# Patient Record
Sex: Male | Born: 1990 | Race: White | Hispanic: No | Marital: Single | Smoking: Current every day smoker
Health system: Southern US, Community
[De-identification: ages and names within clinical notes are randomized; demographics above are authoritative.]

## PROBLEM LIST (undated history)

## (undated) HISTORY — PX: TONSILLECTOMY: SUR1361

## (undated) HISTORY — PX: FRACTURE SURGERY: SHX138

---
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
## 2013-01-27 ENCOUNTER — Emergency Department: Payer: Self-pay | Admitting: Emergency Medicine

## 2013-01-27 LAB — CBC WITH DIFFERENTIAL/PLATELET
Basophil %: 0.1 %
Eosinophil #: 0 10*3/uL (ref 0.0–0.7)
HCT: 43.9 % (ref 40.0–52.0)
HGB: 15.1 g/dL (ref 13.0–18.0)
Lymphocyte #: 2.7 10*3/uL (ref 1.0–3.6)
MCV: 89 fL (ref 80–100)
Neutrophil #: 24.1 10*3/uL — ABNORMAL HIGH (ref 1.4–6.5)
Platelet: 154 10*3/uL (ref 150–440)
RBC: 4.92 10*6/uL (ref 4.40–5.90)

## 2013-01-28 ENCOUNTER — Inpatient Hospital Stay: Payer: Self-pay | Admitting: Internal Medicine

## 2013-01-28 LAB — CBC
MCH: 31.2 pg (ref 26.0–34.0)
MCHC: 35.1 g/dL (ref 32.0–36.0)
MCV: 89 fL (ref 80–100)
Platelet: 123 10*3/uL — ABNORMAL LOW (ref 150–440)
RDW: 13.9 % (ref 11.5–14.5)

## 2013-01-28 LAB — COMPREHENSIVE METABOLIC PANEL
Alkaline Phosphatase: 83 U/L (ref 50–136)
Anion Gap: 5 — ABNORMAL LOW (ref 7–16)
Calcium, Total: 9 mg/dL (ref 8.5–10.1)
Chloride: 100 mmol/L (ref 98–107)
Co2: 30 mmol/L (ref 21–32)
Creatinine: 1.1 mg/dL (ref 0.60–1.30)
EGFR (African American): >60
Sodium: 135 mmol/L — ABNORMAL LOW (ref 136–145)
Total Protein: 6.8 g/dL (ref 6.4–8.2)

## 2013-01-29 LAB — CBC WITH DIFFERENTIAL/PLATELET
HCT: 35.8 % — ABNORMAL LOW (ref 40.0–52.0)
Lymphocyte #: 1.7 10*3/uL (ref 1.0–3.6)
Lymphocyte %: 14.4 %
MCH: 31.1 pg (ref 26.0–34.0)
MCHC: 34.3 g/dL (ref 32.0–36.0)
MCV: 91 fL (ref 80–100)
Monocyte #: 1 x10 3/mm (ref 0.2–1.0)
Monocyte %: 8 %
Neutrophil #: 9.1 10*3/uL — ABNORMAL HIGH (ref 1.4–6.5)
Neutrophil %: 76.5 %
Platelet: 98 10*3/uL — ABNORMAL LOW (ref 150–440)
RBC: 3.96 10*6/uL — ABNORMAL LOW (ref 4.40–5.90)
RDW: 13.4 % (ref 11.5–14.5)
WBC: 11.9 10*3/uL — ABNORMAL HIGH (ref 3.8–10.6)

## 2013-01-29 LAB — BASIC METABOLIC PANEL
Anion Gap: 7 (ref 7–16)
BUN: 8 mg/dL (ref 7–18)
Calcium, Total: 7.9 mg/dL — ABNORMAL LOW (ref 8.5–10.1)
Co2: 23 mmol/L (ref 21–32)
Creatinine: 1.04 mg/dL (ref 0.60–1.30)
EGFR (African American): >60
EGFR (Non-African Amer.): >60
Osmolality: 272 (ref 275–301)
Sodium: 137 mmol/L (ref 136–145)

## 2013-01-29 LAB — TSH: Thyroid Stimulating Horm: 0.7 u[IU]/mL

## 2013-01-29 LAB — VANCOMYCIN, TROUGH: Vancomycin, Trough: 8 ug/mL — ABNORMAL LOW (ref 10–20)

## 2013-01-30 LAB — BASIC METABOLIC PANEL
Calcium, Total: 8.8 mg/dL (ref 8.5–10.1)
Co2: 29 mmol/L (ref 21–32)
Creatinine: 0.92 mg/dL (ref 0.60–1.30)
EGFR (Non-African Amer.): >60
Glucose: 92 mg/dL (ref 65–99)

## 2013-01-30 LAB — CBC WITH DIFFERENTIAL/PLATELET
Basophil #: 0 10*3/uL (ref 0.0–0.1)
Basophil %: 0.2 %
HCT: 39.2 % — ABNORMAL LOW (ref 40.0–52.0)
HGB: 13.2 g/dL (ref 13.0–18.0)
MCH: 30.8 pg (ref 26.0–34.0)
MCHC: 33.7 g/dL (ref 32.0–36.0)
Neutrophil #: 5.7 10*3/uL (ref 1.4–6.5)
Platelet: 116 10*3/uL — ABNORMAL LOW (ref 150–440)
RBC: 4.29 10*6/uL — ABNORMAL LOW (ref 4.40–5.90)
RDW: 13.5 % (ref 11.5–14.5)
WBC: 8.4 10*3/uL (ref 3.8–10.6)

## 2013-02-02 LAB — CULTURE, BLOOD (SINGLE)

## 2013-02-21 ENCOUNTER — Emergency Department: Payer: Self-pay | Admitting: Emergency Medicine

## 2013-02-21 LAB — ETHANOL
Ethanol %: <0.003 % (ref 0.000–0.080)
Ethanol: <3 mg/dL

## 2013-02-21 LAB — DRUG SCREEN, URINE
Amphetamines, Ur Screen: NEGATIVE (ref ?–1000)
Barbiturates, Ur Screen: NEGATIVE (ref ?–200)
Benzodiazepine, Ur Scrn: NEGATIVE (ref ?–200)
Cocaine Metabolite,Ur ~~LOC~~: POSITIVE (ref ?–300)
MDMA (Ecstasy)Ur Screen: NEGATIVE (ref ?–500)
Opiate, Ur Screen: POSITIVE (ref ?–300)
Phencyclidine (PCP) Ur S: NEGATIVE (ref ?–25)
Tricyclic, Ur Screen: NEGATIVE (ref ?–1000)

## 2013-02-21 LAB — URINALYSIS, COMPLETE
Glucose,UR: NEGATIVE mg/dL (ref 0–75)
Ketone: NEGATIVE
Nitrite: NEGATIVE
Ph: 7 (ref 4.5–8.0)
Protein: NEGATIVE
Specific Gravity: 1.01 (ref 1.003–1.030)
Squamous Epithelial: NONE SEEN
WBC UR: <1 /HPF (ref 0–5)

## 2013-02-21 LAB — CBC
HGB: 14.9 g/dL (ref 13.0–18.0)
MCHC: 35 g/dL (ref 32.0–36.0)
MCV: 90 fL (ref 80–100)
Platelet: 163 10*3/uL (ref 150–440)
RBC: 4.74 10*6/uL (ref 4.40–5.90)
RDW: 13.6 % (ref 11.5–14.5)

## 2013-02-21 LAB — COMPREHENSIVE METABOLIC PANEL
Alkaline Phosphatase: 69 U/L (ref 50–136)
Anion Gap: 6 — ABNORMAL LOW (ref 7–16)
Bilirubin,Total: 0.4 mg/dL (ref 0.2–1.0)
Calcium, Total: 9.1 mg/dL (ref 8.5–10.1)
Chloride: 102 mmol/L (ref 98–107)
Co2: 28 mmol/L (ref 21–32)
Creatinine: 1.14 mg/dL (ref 0.60–1.30)
EGFR (African American): >60
Glucose: 88 mg/dL (ref 65–99)
SGPT (ALT): 27 U/L (ref 12–78)

## 2013-10-06 ENCOUNTER — Emergency Department: Payer: Self-pay | Admitting: Emergency Medicine

## 2013-10-06 LAB — URINALYSIS, COMPLETE
BACTERIA: NONE SEEN
BILIRUBIN, UR: NEGATIVE
Blood: NEGATIVE
Glucose,UR: NEGATIVE mg/dL (ref 0–75)
Ketone: NEGATIVE
Leukocyte Esterase: NEGATIVE
Nitrite: NEGATIVE
PH: 6 (ref 4.5–8.0)
Protein: NEGATIVE
RBC, UR: NONE SEEN /HPF (ref 0–5)
SPECIFIC GRAVITY: 1.004 (ref 1.003–1.030)
SQUAMOUS EPITHELIAL: NONE SEEN

## 2013-10-06 LAB — CBC
HCT: 46.4 % (ref 40.0–52.0)
HGB: 15.3 g/dL (ref 13.0–18.0)
MCH: 29.7 pg (ref 26.0–34.0)
MCHC: 33 g/dL (ref 32.0–36.0)
MCV: 90 fL (ref 80–100)
PLATELETS: 227 10*3/uL (ref 150–440)
RBC: 5.15 10*6/uL (ref 4.40–5.90)
RDW: 13.1 % (ref 11.5–14.5)
WBC: 10.2 10*3/uL (ref 3.8–10.6)

## 2013-10-06 LAB — SALICYLATE LEVEL

## 2013-10-06 LAB — COMPREHENSIVE METABOLIC PANEL
ALT: 25 U/L (ref 12–78)
Albumin: 4.3 g/dL (ref 3.4–5.0)
Alkaline Phosphatase: 69 U/L
Anion Gap: 6 — ABNORMAL LOW (ref 7–16)
BUN: 8 mg/dL (ref 7–18)
Bilirubin,Total: 0.5 mg/dL (ref 0.2–1.0)
Calcium, Total: 9.3 mg/dL (ref 8.5–10.1)
Chloride: 105 mmol/L (ref 98–107)
Co2: 29 mmol/L (ref 21–32)
Creatinine: 1.42 mg/dL — ABNORMAL HIGH (ref 0.60–1.30)
EGFR (African American): >60
EGFR (Non-African Amer.): >60
Glucose: 84 mg/dL (ref 65–99)
OSMOLALITY: 277 (ref 275–301)
Potassium: 3.7 mmol/L (ref 3.5–5.1)
SGOT(AST): 28 U/L (ref 15–37)
SODIUM: 140 mmol/L (ref 136–145)
Total Protein: 7.8 g/dL (ref 6.4–8.2)

## 2013-10-06 LAB — DRUG SCREEN, URINE
Amphetamines, Ur Screen: NEGATIVE (ref ?–1000)
BARBITURATES, UR SCREEN: NEGATIVE (ref ?–200)
BENZODIAZEPINE, UR SCRN: POSITIVE (ref ?–200)
CANNABINOID 50 NG, UR ~~LOC~~: NEGATIVE (ref ?–50)
Cocaine Metabolite,Ur ~~LOC~~: NEGATIVE (ref ?–300)
MDMA (ECSTASY) UR SCREEN: NEGATIVE (ref ?–500)
METHADONE, UR SCREEN: NEGATIVE (ref ?–300)
OPIATE, UR SCREEN: POSITIVE (ref ?–300)
Phencyclidine (PCP) Ur S: NEGATIVE (ref ?–25)
TRICYCLIC, UR SCREEN: NEGATIVE (ref ?–1000)

## 2013-10-06 LAB — ETHANOL
ETHANOL %: 0.063 % (ref 0.000–0.080)
Ethanol: 63 mg/dL

## 2013-10-06 LAB — ACETAMINOPHEN LEVEL

## 2014-08-21 NOTE — H&P (Signed)
PATIENT NAME:  Clayton Oconnell, Clayton Oconnell MR#:  [id number] DATE OF BIRTH:  09-30-1990  DATE OF ADMISSION:  01/28/2013  PRIMARY CARE PHYSICIAN:  None local.   REFERRING PHYSICIAN:  Dr. Mindi Junker   CHIEF COMPLAINT:  Left arm redness and tender, swelling for 5 days.   HISTORY OF PRESENT ILLNESS: A 24 year old Caucasian male with no past medical history presented to the ED with right arm redness and swelling, tenderness for the past 5 days. The patient came to ED, was given clindamycin yesterday, but the redness and swelling is worsening and also the redness extending, so patient came to ED again today. The patient was noted to have leukocytosis at 19.2 and also patient the patient complains of some fever, chills, but denies any other symptoms. The patient was treated with 1000 in ED. The patient said he used heroin injection 5 days ago and developed this tenderness and swelling of right arm.   SOCIAL HISTORY: Smoking 1 pack a day for a few years and use heroine injection and denies any alcohol drinking.   FAMILY HISTORY:  Parents are healthy and no family history.   MEDICATIONS:  No.  ALLERGIES: No.  REVIEW OF SYSTEMS:  CONSTITUTIONAL: The patient has chill and chills. No headache or dizziness. No weakness.  EYES: No double vision or blurred vision.  ENT: No postnasal drip, slurred speech or dysphagia.  CARDIOVASCULAR: No chest pain, palpitation, orthopnea. No nocturnal dyspnea. No leg edema.  PULMONARY: No cough, sputum, shortness of breath or hemoptysis.  GASTROINTESTINAL: No abdominal pain, nausea, vomiting or diarrhea. No melena or bloody stool.  GENITOURINARY: No dysuria, hematuria or incontinence.  SKIN: No rash or jaundice.  MUSCULOSKELETAL: Right arm pain, redness and swelling.  ENDOCRINOLOGY: No polyuria, polydipsia, heat or cold intolerance.  NEUROLOGY: No syncope, loss of consciousness or seizure.   PHYSICAL EXAMINATION: VITAL SIGNS: Temperature 98.4, blood pressure 127/69, pulse 88,  respirations 20, oxygen saturation 98% on room air.  GENERAL: The patient is alert, awake, oriented, in no acute distress.  HEENT: Pupils round, equal and reactive to light and accommodation. NECK:  Supple.  No JVD or carotid bruit.  No lymphadenopathy.  No thyromegaly. Moist oral mucosa. Clear oropharynx.  CARDIOVASCULAR: S1, S2 regular rate and rhythm. No murmurs or gallops.  PULMONARY: Bilateral air entry. No wheezing or rales. No use of accessory muscle to breathe.  EXTREMITIES: No edema, clubbing or cyanosis. No calf tenderness.  ABDOMEN: Soft. No distention or tenderness. No organomegaly. Bowel sounds present.  NEUROLOGY: AO x 3. No focal deficit. Power 5/5. Sensation intact.   LABORATORY DATA: Glucose 103, BUN 12, creatinine 1.1, sodium 135, potassium 3.6, chloride 100, bicarb 30. WBC 19.2, hemoglobin 13.8, platelets 123.   Blood culture since yesterday is negative. Right upper extremity ultrasound duplex and no DVT.   IMPRESSIONS: 1.  Right arm cellulitis.  2.  Leukocytosis.  3.  Thrombocytopenia.  4.  Hyponatremia.  5.  Drug abuse, heroine abuse.  6.  Tobacco abuse.   PLAN OF TREATMENT: 1.  The patient will be admitted to medical floor. We will continue Zosyn and vanco and follow up CBC, blood culture.  2.  Will give normal saline IV fluid support.  3. The patient was counseled for smoking cessation for 7 minutes, and also the patient was counseled for drug abuse cessation. I discussed the patient's condition and plan of treatment with the patient.   TIME SPENT:  About 45 minutes.    ____________________________ Shaune Pollack, MD qc:ce D: 01/28/2013 16:54:44  ET T: 01/28/2013 17:05:29 ET JOB#: [id number]  cc: Shaune PollackQing Dalin Caldera, MD, <Dictator> Shaune PollackQING Romualdo Prosise MD ELECTRONICALLY SIGNED 01/30/2013 15:03

## 2014-08-21 NOTE — Discharge Summary (Signed)
PATIENT NAME:  Clayton Oconnell, Clayton Oconnell MR#:  [id number] DATE OF BIRTH:  [birth year]  DATE OF ADMISSION:  01/28/2013 DATE OF DISCHARGE:  01/30/2013  PRIMARY CARE PHYSICIAN: Nonlocal.  DISCHARGE DIAGNOSES: 1.  Right arm cellulitis associated with IV drug abuse.  2.  Thrombocytopenia.  3.  Tobacco abuse.   CONDITION: Stable.   CODE STATUS: FULL CODE.   HOME MEDICATIONS:  1.  Suboxone 8 mg/2 mg sublingual film to each sublingual once a day. 2.  Ketoralac 10 mg p.o. 4 times a day.  3.  Nicotine patch 14 mg 24 hour transdermal film 1 patch transdermal once a day.  4.  Augmentin 875 mg/125 mg p.o. q. 12 hours for 10 days.   DIET: Regular diet.   ACTIVITY: As tolerated.   FOLLOW-UP CARE: [facility] 1 to 2 weeks. The patient was counseled for smoking cessation and drug abuse was counseled.   REASON FOR ADMISSION: Left arm redness and tenderness and swelling for 5 days.   HOSPITAL COURSE: The patient is a 24 year old Caucasian male with no past medical history who presented to the ED with right arm redness, swelling and tenderness for the past 5 days. The patient admitted he injected heroin at home and then developed right arm redness and tenderness with worsening extension. The patient also had some fever and chills. White count increased to 19.2. The patient was treated with clindamycin without improvement. For detailed history and physical examination, please refer to the admission note dictated by me. On admission date, the patient's glucose was 103, BUN 12, creatinine 1.1, sodium 135, potassium 3.6, chloride 100, bicarb, WBC 19.2, hemoglobin 13.8 and platelets 123. Right upper extremity ultrasound did not show any DVT. The patient was admitted for right arm cellulitis with leukocytosis. After admission, the patient has been treated with Zosyn, vancomycin and blood culture was sent, which was negative. The patient's symptoms have much improved and right arm swelling,  erythema and tenderness is much better. White count decreased to 8.4. The patient also has thrombocytopenia, which is stable. The patient has been smoking for many years, and the patient was counseled for smoking cessation. The patient is clinically stable and will be discharged to home today. I discussed the patient's discharge plan with the patient, case manager and nurse.   TIME SPENT: About 33 minutes.  ____________________________ Shaune PollackQing Ketra Duchesne, MD qc:sb D: 01/30/2013 14:19:29 ET T: 01/30/2013 16:11:16 ET JOB#: [id number]  cc: Shaune PollackQing Karime Scheuermann, MD, <Dictator> Shaune PollackQING Eddi Hymes MD ELECTRONICALLY SIGNED 01/31/2013 15:19

## 2015-10-20 ENCOUNTER — Ambulatory Visit: Payer: Self-pay | Admitting: Internal Medicine

## 2017-01-24 ENCOUNTER — Emergency Department: Payer: Self-pay

## 2017-01-24 ENCOUNTER — Emergency Department
Admission: EM | Admit: 2017-01-24 | Discharge: 2017-01-24 | Disposition: A | Discharge status: Home / Self Care | Payer: Self-pay | Attending: Emergency Medicine | Admitting: Emergency Medicine

## 2017-01-24 DIAGNOSIS — Y998 Other external cause status: Secondary | ICD-10-CM | POA: Insufficient documentation

## 2017-01-24 DIAGNOSIS — Y92410 Unspecified street and highway as the place of occurrence of the external cause: Secondary | ICD-10-CM | POA: Insufficient documentation

## 2017-01-24 DIAGNOSIS — Z87891 Personal history of nicotine dependence: Secondary | ICD-10-CM | POA: Insufficient documentation

## 2017-01-24 DIAGNOSIS — S60221A Contusion of right hand, initial encounter: Secondary | ICD-10-CM | POA: Insufficient documentation

## 2017-01-24 DIAGNOSIS — Y939 Activity, unspecified: Secondary | ICD-10-CM | POA: Insufficient documentation

## 2017-01-24 DIAGNOSIS — S90511A Abrasion, right ankle, initial encounter: Secondary | ICD-10-CM | POA: Insufficient documentation

## 2017-01-24 MED ORDER — MUPIROCIN CALCIUM 2 % EX CREA: TOPICAL_CREAM | CUTANEOUS | 0 refills | Status: DC

## 2017-01-24 MED ORDER — TETANUS-DIPHTH-ACELL PERTUSSIS 5-2.5-18.5 LF-MCG/0.5 IM SUSP
INTRAMUSCULAR | Status: AC
  Filled 2017-01-24: qty 0.5

## 2017-01-24 MED ORDER — SULFAMETHOXAZOLE-TRIMETHOPRIM 800-160 MG PO TABS: 1.0000 | ORAL_TABLET | Freq: Two times a day (BID) | ORAL | 0 refills | Status: DC

## 2017-01-24 MED ORDER — SULFAMETHOXAZOLE-TRIMETHOPRIM 800-160 MG PO TABS
1.0000 | ORAL_TABLET | Freq: Once | ORAL | Status: AC
  Administered 2017-01-24: 1 via ORAL

## 2017-01-24 MED ORDER — CEPHALEXIN 500 MG PO CAPS
ORAL_CAPSULE | ORAL | Status: AC
  Filled 2017-01-24: qty 1

## 2017-01-24 MED ORDER — CEPHALEXIN 500 MG PO CAPS: 500.0000 mg | ORAL_CAPSULE | Freq: Four times a day (QID) | ORAL | 0 refills | Status: DC

## 2017-01-24 MED ORDER — TETANUS-DIPHTH-ACELL PERTUSSIS 5-2.5-18.5 LF-MCG/0.5 IM SUSP
0.5000 mL | Freq: Once | INTRAMUSCULAR | Status: AC
  Administered 2017-01-24: 0.5 mL via INTRAMUSCULAR

## 2017-01-24 MED ORDER — CEPHALEXIN 500 MG PO CAPS
500.0000 mg | ORAL_CAPSULE | Freq: Once | ORAL | Status: AC
  Administered 2017-01-24: 500 mg via ORAL

## 2017-01-24 MED ORDER — SULFAMETHOXAZOLE-TRIMETHOPRIM 800-160 MG PO TABS
ORAL_TABLET | ORAL | Status: AC
  Filled 2017-01-24: qty 1

## 2017-01-24 MED ORDER — NICOTINE 14 MG/24HR TD PT24: 14.0000 mg | MEDICATED_PATCH | Freq: Once | TRANSDERMAL | Status: DC

## 2017-01-24 NOTE — ED Notes (Signed)
Keys, shoes, charger
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
## 2017-01-24 NOTE — ED Notes (Signed)
Pt given crutches

## 2017-01-24 NOTE — ED Notes (Signed)
Family at bedside. 

## 2017-01-25 NOTE — ED Notes (Signed)
Pt called stating he lost his Rx for Abx. Attempted to return call to pt asking which pharmacy they use with no answer a message was left at [phone]

## 2017-03-22 ENCOUNTER — Other Ambulatory Visit
Admit: 2017-03-22 | Discharge: 2017-03-22 | Disposition: A | Discharge status: Home / Self Care | Attending: Family Medicine | Admitting: Family Medicine
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
## 2017-05-24 ENCOUNTER — Emergency Department: Payer: No Typology Code available for payment source

## 2017-05-24 ENCOUNTER — Other Ambulatory Visit: Payer: Self-pay

## 2017-05-24 ENCOUNTER — Emergency Department
Admission: EM | Admit: 2017-05-24 | Discharge: 2017-05-24 | Disposition: A | Discharge status: Home / Self Care | Payer: No Typology Code available for payment source | Attending: Emergency Medicine | Admitting: Emergency Medicine

## 2017-05-24 ENCOUNTER — Encounter: Payer: Self-pay | Admitting: Emergency Medicine

## 2017-05-24 DIAGNOSIS — S39012A Strain of muscle, fascia and tendon of lower back, initial encounter: Secondary | ICD-10-CM | POA: Insufficient documentation

## 2017-05-24 DIAGNOSIS — M5430 Sciatica, unspecified side: Secondary | ICD-10-CM

## 2017-05-24 DIAGNOSIS — M5431 Sciatica, right side: Secondary | ICD-10-CM | POA: Diagnosis not present

## 2017-05-24 DIAGNOSIS — Y939 Activity, unspecified: Secondary | ICD-10-CM | POA: Insufficient documentation

## 2017-05-24 DIAGNOSIS — F17210 Nicotine dependence, cigarettes, uncomplicated: Secondary | ICD-10-CM | POA: Insufficient documentation

## 2017-05-24 DIAGNOSIS — Y929 Unspecified place or not applicable: Secondary | ICD-10-CM | POA: Insufficient documentation

## 2017-05-24 DIAGNOSIS — Y999 Unspecified external cause status: Secondary | ICD-10-CM | POA: Insufficient documentation

## 2017-05-24 DIAGNOSIS — M545 Low back pain: Secondary | ICD-10-CM | POA: Diagnosis present

## 2017-05-24 MED ORDER — BACLOFEN 10 MG PO TABS: 10.0000 mg | ORAL_TABLET | Freq: Every day | ORAL | 1 refills | Status: DC

## 2017-05-24 MED ORDER — PREDNISONE 10 MG (48) PO TBPK: ORAL_TABLET | ORAL | 0 refills | Status: DC
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
## 2017-05-24 NOTE — Discharge Instructions (Signed)
Follow up with your regular doctor or Dr Allena KatzPatel if not better in 5-7 days.  Use the medication as prescribed.  Use wet heat followed by ice to loosen the muscles.  Return to the ER if worsening
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
## 2017-09-09 ENCOUNTER — Emergency Department
Admission: EM | Admit: 2017-09-09 | Discharge: 2017-09-09 | Disposition: A | Discharge status: Home / Self Care | Attending: Emergency Medicine | Admitting: Emergency Medicine

## 2017-09-09 ENCOUNTER — Other Ambulatory Visit: Payer: Self-pay

## 2017-09-09 ENCOUNTER — Encounter: Payer: Self-pay | Admitting: Emergency Medicine

## 2017-09-09 DIAGNOSIS — L02415 Cutaneous abscess of right lower limb: Secondary | ICD-10-CM | POA: Insufficient documentation

## 2017-09-09 DIAGNOSIS — F17210 Nicotine dependence, cigarettes, uncomplicated: Secondary | ICD-10-CM | POA: Insufficient documentation

## 2017-09-09 MED ORDER — TRAMADOL HCL 50 MG PO TABS: 50.0000 mg | ORAL_TABLET | Freq: Four times a day (QID) | ORAL | 0 refills | Status: DC | PRN

## 2017-09-09 MED ORDER — SULFAMETHOXAZOLE-TRIMETHOPRIM 800-160 MG PO TABS: 1.0000 | ORAL_TABLET | Freq: Two times a day (BID) | ORAL | 0 refills | Status: DC

## 2017-09-09 MED ORDER — LIDOCAINE HCL (PF) 1 % IJ SOLN
5.0000 mL | Freq: Once | INTRAMUSCULAR | Status: AC
  Administered 2017-09-09: 5 mL
  Filled 2017-09-09: qty 5

## 2017-09-09 MED ORDER — OXYCODONE HCL 5 MG PO TABS
5.0000 mg | ORAL_TABLET | Freq: Once | ORAL | Status: AC
Start: 2017-09-09 — End: 2017-09-09
  Administered 2017-09-09: 5 mg via ORAL
  Filled 2017-09-09: qty 1

## 2017-09-09 NOTE — ED Triage Notes (Signed)
Presents with possible infection to right knee  States he fell and "skinned" his knee a few weeks ago   Was treated and area had gotten better  Then area area returned yesterday

## 2017-09-09 NOTE — Discharge Instructions (Addendum)
Follow-up with [facility] acute care or urgent care of your choice for packing removal in 2 days. Begin taking Bactrim DS twice daily for 10 days for infection.  Tramadol as needed for pain every 6 hours.  Do not drive or operate machinery while taking this medication as it could increase your  chances for injury.  Return to the emergency room if any severe worsening of your symptoms.

## 2017-09-09 NOTE — ED Provider Notes (Signed)
[facility] Emergency Department Provider Note  ____________________________________________   First MD Initiated Contact with Patient 09/09/17 1221     (approximate)  I have reviewed the triage vital signs and the nursing notes.   HISTORY  Chief Complaint No chief complaint on file.    HPI Clayton Oconnell is a 27 y.o. male is here with right knee pain that has slowly gotten worse.  Patient states that he fell and had abrasion to his knee.  He states that this was healing well until the area turned red yesterday.  Patient states touching the area is extremely tender.  He denies any fever or chills.  Currently rates his pain as 3 out of 10 unless you are touching it.  History reviewed. No pertinent past medical history.  There are no active problems to display for this patient.   Past Surgical History:  Procedure Laterality Date  . FRACTURE SURGERY      Prior to Admission medications   Medication Sig Start Date End Date Taking? Authorizing Provider  sulfamethoxazole-trimethoprim (BACTRIM DS,SEPTRA DS) 800-160 MG tablet Take 1 tablet by mouth 2 (two) times daily. 09/09/17   Tommi Rumps, PA-C  traMADol (ULTRAM) 50 MG tablet Take 1 tablet (50 mg total) by mouth every 6 (six) hours as needed. 09/09/17   Tommi Rumps, PA-C    Allergies Tylenol [acetaminophen]  No family history on file.  Social History Social History   Tobacco Use  . Smoking status: Current Every Day Smoker    Packs/day: 1.00    Years: 10.00    Pack years: 10.00    Types: Cigarettes    Last attempt to quit: 01/24/2017    Years since quitting: 0.6  . Smokeless tobacco: Current User  Substance Use Topics  . Alcohol use: No    Alcohol/week: 0.6 oz    Types: 1 Cans of beer per week    Frequency: Never  . Drug use: No    Review of Systems Constitutional: No fever/chills Cardiovascular: Denies chest pain. Respiratory: Denies shortness of  breath. Musculoskeletal: Positive right knee pain. Skin: Positive for abscess. ____________________________________________   PHYSICAL EXAM:  VITAL SIGNS: ED Triage Vitals  Enc Vitals Group     BP 09/09/17 1210 133/77     Pulse Rate 09/09/17 1210 64     Resp 09/09/17 1210 20     Temp 09/09/17 1210 98 F (36.7 C)     Temp Source 09/09/17 1210 Oral     SpO2 09/09/17 1210 100 %     Weight 09/09/17 1213 185 lb (83.9 kg)     Height 09/09/17 1213  (1.905 m)     Head Circumference --      Peak Flow --      Pain Score 09/09/17 1213 3     Pain Loc --      Pain Edu? --      Excl. in GC? --    Constitutional: Alert and oriented. Well appearing and in no acute distress. Eyes: Conjunctivae are normal.  Head: Atraumatic. Neck: No stridor.   Cardiovascular: Normal rate, regular rhythm. Grossly normal heart sounds.  Good peripheral circulation. Respiratory: Normal respiratory effort.  No retractions. Lungs CTAB. Musculoskeletal: Examination of the right knee there is no gross deformity other than a soft erythematous area to the medial aspect.  Patient range of motion and weightbearing is normal. Neurologic:  Normal speech and language. No gross focal neurologic deficits are appreciated. No gait  instability. Skin:  Skin is warm, dry and intact.  Medial aspect of the right knee there is a fluctuant red area without drainage.  Area is moderately tender to touch.  Consistent with abscess. Psychiatric: Mood and affect are normal. Speech and behavior are normal.  ____________________________________________   LABS (all labs ordered are listed, but only abnormal results are displayed)  Labs Reviewed - No data to display  PROCEDURES  Procedure(s) performed:   Marland KitchenMarland KitchenIncision and Drainage Date/Time: 09/09/2017 2:42 PM Performed by: Tommi Rumps, PA-C Authorized by: Tommi Rumps, PA-C   Consent:    Consent obtained:  Verbal   Consent given by:  Patient   Risks discussed:   Pain   Alternatives discussed:  No treatment Location:    Type:  Abscess   Location:  Lower extremity   Lower extremity location:  Knee   Knee location:  R knee Pre-procedure details:    Skin preparation:  Betadine Anesthesia (see MAR for exact dosages):    Anesthesia method:  Local infiltration   Local anesthetic:  Lidocaine 1% w/o epi Procedure type:    Complexity:  Simple Procedure details:    Needle aspiration: no     Incision types:  Stab incision   Incision depth:  Subcutaneous   Scalpel blade:  11   Wound management:  Probed and deloculated   Drainage:  Purulent and bloody   Drainage amount:  Moderate   Wound treatment:  Drain placed   Packing materials:  1/2 in gauze Post-procedure details:    Patient tolerance of procedure:  Tolerated well, no immediate complications    Critical Care performed: No  ____________________________________________   INITIAL IMPRESSION / ASSESSMENT AND PLAN / ED COURSE Patient with abscess to the right knee.  Area was drained.  Patient was given instructions on care and drain removal in 2 days.  Patient was given a prescription for Bactrim DS twice daily for 10 days and tramadol as needed for pain.  ____________________________________________   FINAL CLINICAL IMPRESSION(S) / ED DIAGNOSES  Final diagnoses:  Abscess of right knee     ED Discharge Orders        Ordered    sulfamethoxazole-trimethoprim (BACTRIM DS,SEPTRA DS) 800-160 MG tablet  2 times daily     09/09/17 1339    traMADol (ULTRAM) 50 MG tablet  Every 6 hours PRN     09/09/17 1339       Note:  This document was prepared using Dragon voice recognition software and may include unintentional dictation errors.    Tommi Rumps, PA-C 09/09/17 1445    Minna Antis, MD 09/09/17 1539

## 2018-04-03 ENCOUNTER — Emergency Department: Payer: Self-pay

## 2018-04-03 ENCOUNTER — Other Ambulatory Visit: Payer: Self-pay

## 2018-04-03 ENCOUNTER — Emergency Department
Admission: EM | Admit: 2018-04-03 | Discharge: 2018-04-04 | Disposition: A | Discharge status: Home / Self Care | Payer: Self-pay | Attending: Emergency Medicine | Admitting: Emergency Medicine

## 2018-04-03 DIAGNOSIS — F1910 Other psychoactive substance abuse, uncomplicated: Secondary | ICD-10-CM | POA: Insufficient documentation

## 2018-04-03 DIAGNOSIS — F1310 Sedative, hypnotic or anxiolytic abuse, uncomplicated: Secondary | ICD-10-CM | POA: Insufficient documentation

## 2018-04-03 DIAGNOSIS — Z79899 Other long term (current) drug therapy: Secondary | ICD-10-CM | POA: Insufficient documentation

## 2018-04-03 DIAGNOSIS — R4781 Slurred speech: Secondary | ICD-10-CM | POA: Insufficient documentation

## 2018-04-03 DIAGNOSIS — F17210 Nicotine dependence, cigarettes, uncomplicated: Secondary | ICD-10-CM | POA: Insufficient documentation

## 2018-04-03 DIAGNOSIS — F1210 Cannabis abuse, uncomplicated: Secondary | ICD-10-CM | POA: Insufficient documentation

## 2018-04-03 DIAGNOSIS — F1410 Cocaine abuse, uncomplicated: Secondary | ICD-10-CM | POA: Insufficient documentation

## 2018-04-03 DIAGNOSIS — F10920 Alcohol use, unspecified with intoxication, uncomplicated: Secondary | ICD-10-CM

## 2018-04-03 LAB — COMPREHENSIVE METABOLIC PANEL
ALK PHOS: 54 U/L (ref 38–126)
ALT: 198 U/L — AB (ref 0–44)
AST: 96 U/L — ABNORMAL HIGH (ref 15–41)
Albumin: 4.4 g/dL (ref 3.5–5.0)
Anion gap: 10 (ref 5–15)
BUN: 18 mg/dL (ref 6–20)
CALCIUM: 10 mg/dL (ref 8.9–10.3)
CHLORIDE: 102 mmol/L (ref 98–111)
CO2: 28 mmol/L (ref 22–32)
CREATININE: 1.11 mg/dL (ref 0.61–1.24)
GFR calc non Af Amer: >60 mL/min (ref >60)
Glucose, Bld: 89 mg/dL (ref 70–99)
Potassium: 3.7 mmol/L (ref 3.5–5.1)
SODIUM: 140 mmol/L (ref 135–145)
Total Bilirubin: 0.3 mg/dL (ref 0.3–1.2)
Total Protein: 7.6 g/dL (ref 6.5–8.1)

## 2018-04-03 LAB — ACETAMINOPHEN LEVEL: Acetaminophen (Tylenol), Serum: <10 ug/mL — ABNORMAL LOW (ref 10–30)

## 2018-04-03 LAB — SALICYLATE LEVEL: Salicylate Lvl: <7 mg/dL (ref 2.8–30.0)

## 2018-04-03 LAB — CBC
HCT: 48.6 % (ref 39.0–52.0)
HEMOGLOBIN: 16.8 g/dL (ref 13.0–17.0)
MCH: 30.9 pg (ref 26.0–34.0)
MCHC: 34.6 g/dL (ref 30.0–36.0)
MCV: 89.5 fL (ref 80.0–100.0)
NRBC: 0 % (ref 0.0–0.2)
Platelets: 197 10*3/uL (ref 150–400)
RBC: 5.43 MIL/uL (ref 4.22–5.81)
RDW: 13.8 % (ref 11.5–15.5)
WBC: 10.6 10*3/uL — AB (ref 4.0–10.5)

## 2018-04-03 LAB — URINE DRUG SCREEN, QUALITATIVE (ARMC ONLY)
AMPHETAMINES, UR SCREEN: NOT DETECTED
BENZODIAZEPINE, UR SCRN: POSITIVE — AB
Barbiturates, Ur Screen: NOT DETECTED
COCAINE METABOLITE, UR ~~LOC~~: POSITIVE — AB
Cannabinoid 50 Ng, Ur ~~LOC~~: POSITIVE — AB
MDMA (Ecstasy)Ur Screen: NOT DETECTED
METHADONE SCREEN, URINE: NOT DETECTED
OPIATE, UR SCREEN: NOT DETECTED
PHENCYCLIDINE (PCP) UR S: NOT DETECTED
Tricyclic, Ur Screen: NOT DETECTED

## 2018-04-03 LAB — ETHANOL: Alcohol, Ethyl (B): 56 mg/dL — ABNORMAL HIGH (ref <10)
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
## 2018-04-03 NOTE — ED Provider Notes (Signed)
[facility] Emergency Department Provider Note  ____________________________________________  Time seen: Approximately 7:59 PM  I have reviewed the triage vital signs and the nursing notes.   HISTORY  Chief Complaint Alcohol Intoxication  Level 5 caveat:  Portions of the history and physical were unable to be obtained due to intoxication   HPI Clayton Oconnell is a 27 y.o. male with no known history who was brought in IVC by police for erratic behavior.  Patient is clearly intoxicated.  Patient thinks that he is in [place].  Patient endorses alcohol, cocaine, IV heroin.  Patient is slurring his speech and unable to provide any further history at this time. Patient keeps asking for food, says he is hungry and has not eaten in 3 days. He denies any trauma, CP, SOB, abdominal pain. He denies SI or HI  PMH Unknown  Past Surgical History:  Procedure Laterality Date  . FRACTURE SURGERY      Prior to Admission medications   Medication Sig Start Date End Date Taking? Authorizing Provider  sulfamethoxazole-trimethoprim (BACTRIM DS,SEPTRA DS) 800-160 MG tablet Take 1 tablet by mouth 2 (two) times daily. 09/09/17   Tommi Rumps, PA-C  traMADol (ULTRAM) 50 MG tablet Take 1 tablet (50 mg total) by mouth every 6 (six) hours as needed. 09/09/17   Tommi Rumps, PA-C    Allergies Tylenol [acetaminophen]  No family history on file.  Social History Social History   Tobacco Use  . Smoking status: Current Every Day Smoker    Packs/day: 1.00    Years: 10.00    Pack years: 10.00    Types: Cigarettes    Last attempt to quit: 01/24/2017    Years since quitting: 1.1  . Smokeless tobacco: Current User  Substance Use Topics  . Alcohol use: Yes    Alcohol/week: 1.0 standard drinks    Types: 1 Cans of beer per week    Frequency: Never  . Drug use: Yes    Types: Cocaine, Marijuana    Comment: heroin    Review of Systems  Constitutional: + altered  mental status  ____________________________________________   PHYSICAL EXAM:  VITAL SIGNS: ED Triage Vitals  Enc Vitals Group     BP 04/03/18 1850 (!) 141/68     Pulse Rate 04/03/18 1850 98     Resp 04/03/18 1850 17     Temp --      Temp src --      SpO2 04/03/18 1850 99 %     Weight 04/03/18 1856 160 lb (72.6 kg)     Height 04/03/18 1856 5\' 10"  (1.778 m)     Head Circumference --      Peak Flow --      Pain Score --      Pain Loc --      Pain Edu? --      Excl. in GC? --     Constitutional: Awake, confused, slurring his speech, looks intoxicated HEENT:      Head: Normocephalic and atraumatic.         Eyes: Conjunctivae are normal. Sclera is non-icteric.       Mouth/Throat: Mucous membranes are moist.       Neck: Supple with no signs of meningismus. Cardiovascular: Regular rate and rhythm. No murmurs, gallops, or rubs. 2+ symmetrical distal pulses are present in all extremities. No JVD. Respiratory: Normal respiratory effort. Lungs are clear to auscultation bilaterally. No wheezes, crackles, or rhonchi.  Gastrointestinal: Soft,  non tender, and non distended with positive bowel sounds. No rebound or guarding. Musculoskeletal: Nontender with normal range of motion in all extremities. No edema, cyanosis, or erythema of extremities. Neurologic: Slurred speech, confused. Face is symmetric. Moving all extremities. No gross focal neurologic deficits are appreciated. Skin: Skin is warm, dry and intact. No rash noted. IV track marks on b/l arms  ____________________________________________   LABS (all labs ordered are listed, but only abnormal results are displayed)  Labs Reviewed  COMPREHENSIVE METABOLIC PANEL - Abnormal; Notable for the following components:      Result Value   AST 96 (*)    ALT 198 (*)    All other components within normal limits  ETHANOL - Abnormal; Notable for the following components:   Alcohol, Ethyl (B) 56 (*)    All other components within normal  limits  CBC - Abnormal; Notable for the following components:   WBC 10.6 (*)    All other components within normal limits  URINE DRUG SCREEN, QUALITATIVE ([facility] ONLY) - Abnormal; Notable for the following components:   Cocaine Metabolite,Ur [place] POSITIVE (*)    Cannabinoid 50 Ng, Ur [place] POSITIVE (*)    Benzodiazepine, Ur [place] POSITIVE (*)    All other components within normal limits  ACETAMINOPHEN LEVEL - Abnormal; Notable for the following components:   Acetaminophen (Tylenol), Serum <10 (*)    All other components within normal limits  SALICYLATE LEVEL  TROPONIN I   ____________________________________________  EKG  ED ECG REPORT I, Nita Sicklearolina Daymian Lill, the attending physician, personally viewed and interpreted this ECG.  Normal sinus rhythm, rate of 100, right axis deviation, normal intervals, T wave inversions in inferior lateral leads with no ST elevation. No prior for comparison ____________________________________________  RADIOLOGY  I have personally reviewed the images performed during this visit and I agree with the Radiologist's read.   Interpretation by Radiologist:  Ct Head Wo Contrast  Result Date: 04/03/2018 CLINICAL DATA:  27 year old male with encephalopathy. EXAM: CT HEAD WITHOUT CONTRAST TECHNIQUE: Contiguous axial images were obtained from the base of the skull through the vertex without intravenous contrast. COMPARISON:  Head CT dated 10/22/2006 FINDINGS: Brain: No evidence of acute infarction, hemorrhage, hydrocephalus, extra-axial collection or mass lesion/mass effect. Vascular: No hyperdense vessel or unexpected calcification. Skull: Normal. Negative for fracture or focal lesion. Sinuses/Orbits: No acute finding. Other: None IMPRESSION: Normal noncontrast CT of the brain. Electronically Signed   By: Elgie CollardArash  Radparvar M.D.   On: 04/03/2018 21:26     ____________________________________________   PROCEDURES  Procedure(s) performed: None Procedures Critical  Care performed:  None ____________________________________________   INITIAL IMPRESSION / ASSESSMENT AND PLAN / ED COURSE  27 y.o. male with no known history who was brought in IVC by police for erratic behavior and confusion.  Patient endorses alcohol, cocaine, IV heroin.  Patient has a bruise on the right forehead after trying to get up and run from the emergency room.  He did not sustain any trauma to the head.  The bruise was caused by handling the patient back into the bed.  Is got IV track marks on bilateral arms.  His vitals are within normal limits with no fever.  Will check labs including alcohol level and drug screen.  We will keep patient IVC and to his sober for further reevaluation.    _________________________ 11:43 PM on 04/03/2018 -----------------------------------------  Drug screen positive for benzos, cannabinoids, cocaine. Alcohol level 56.  Head CT negative for acute trauma.  Patient remains extremely intoxicated.  We will continue to monitor until patient is sober for reevaluation.  Care transferred to Dr. Lamont Snowball.   As part of my medical decision making, I reviewed the following data within the electronic MEDICAL RECORD NUMBER Nursing notes reviewed and incorporated, Labs reviewed , EKG interpreted , Radiograph reviewed , Notes from prior ED visits and [place] Controlled Substance Database    Pertinent labs & imaging results that were available during my care of the patient were reviewed by me and considered in my medical decision making (see chart for details).    ____________________________________________   FINAL CLINICAL IMPRESSION(S) / ED DIAGNOSES  Final diagnoses:  Polysubstance abuse (HCC)  Acute alcoholic intoxication without complication (HCC)  Cocaine abuse (HCC)  Synthetic cannabinoid abuse (HCC)  Benzodiazepine abuse (HCC)      NEW MEDICATIONS STARTED DURING THIS VISIT:  ED Discharge Orders    None       Note:  This document was prepared  using Dragon voice recognition software and may include unintentional dictation errors.    Nita Sickle, MD 04/03/18 [phone]

## 2018-04-04 LAB — TROPONIN I: Troponin I: <0.03 ng/mL (ref <0.03)

## 2018-04-04 NOTE — ED Notes (Signed)
Hourly rounding reveals patient in [address]. No complaints, stable, in no acute distress. Q15 minute rounds and monitoring via Psychologist, counsellingover and Officer to continue.

## 2018-04-04 NOTE — ED Notes (Signed)
Hourly rounding reveals patient in 19 hall. No complaints, stable, in no acute distress. Q15 minute rounds and monitoring via Rover and Officer to continue.   

## 2018-04-04 NOTE — ED Notes (Signed)
Nursing 1:1 note D:Pt observed sleeping in bed with eyes closed. Respirations even and unlabored. No distress noted. A: 1:1 observation continues for safety  R: pt remains safe  

## 2018-04-04 NOTE — ED Notes (Signed)
Nursing 1:1 note D:Pt is awake lying in bed. Respirations even and unlabored. No distress noted. A: 1:1 observation continues for safety  R: pt remains safe

## 2018-04-04 NOTE — ED Notes (Signed)
Hourly rounding reveals patient in room. No complaints, stable, in no acute distress. Q15 minute rounds and monitoring via Rover and Officer to continue.   

## 2018-04-04 NOTE — ED Notes (Signed)
Hourly rounding reveals patient in 19 hall. No complaints, stable, in no acute distress. Q15 minute rounds and monitoring via Psychologist, counsellingover and Officer to continue.

## 2018-04-04 NOTE — ED Notes (Signed)
Hourly rounding reveals patient in 19 Hall. No complaints, stable, in no acute distress. Q15 minute rounds and monitoring via Rover and Officer to continue.   

## 2018-04-04 NOTE — ED Provider Notes (Signed)
The patient is now awake and clinically sober.  He is surprised to find himself in an emergency department.  He is alert and oriented x4 and clearly not psychotic.  He contracts for safety.  He says he was not trying to hurt himself last night but was "just trying to get fucked up."  I have reversed his involuntary commitment, we will give him breakfast, and he will be discharged.   Merrily Brittleifenbark, Byard Carranza, MD 04/04/18 [phone]

## 2018-04-04 NOTE — ED Notes (Signed)
Pt refused vital signs.  Discharged to home.  Asked pt did he need to call someone for a ride, pt refused.  Pt denies SI, HI, and A/V hallucinations.

## 2018-09-12 ENCOUNTER — Encounter: Payer: Self-pay | Admitting: Emergency Medicine

## 2018-09-12 ENCOUNTER — Other Ambulatory Visit: Payer: Self-pay

## 2018-09-12 ENCOUNTER — Emergency Department
Admission: EM | Admit: 2018-09-12 | Discharge: 2018-09-12 | Disposition: A | Discharge status: Home / Self Care | Payer: Self-pay | Attending: Emergency Medicine | Admitting: Emergency Medicine

## 2018-09-12 ENCOUNTER — Emergency Department
Admission: EM | Admit: 2018-09-12 | Discharge: 2018-09-13 | Disposition: A | Discharge status: Home / Self Care | Payer: Self-pay | Attending: Emergency Medicine | Admitting: Emergency Medicine

## 2018-09-12 DIAGNOSIS — F1210 Cannabis abuse, uncomplicated: Secondary | ICD-10-CM | POA: Insufficient documentation

## 2018-09-12 DIAGNOSIS — F1410 Cocaine abuse, uncomplicated: Secondary | ICD-10-CM | POA: Insufficient documentation

## 2018-09-12 DIAGNOSIS — F331 Major depressive disorder, recurrent, moderate: Secondary | ICD-10-CM

## 2018-09-12 DIAGNOSIS — F1910 Other psychoactive substance abuse, uncomplicated: Secondary | ICD-10-CM | POA: Insufficient documentation

## 2018-09-12 DIAGNOSIS — F17210 Nicotine dependence, cigarettes, uncomplicated: Secondary | ICD-10-CM | POA: Insufficient documentation

## 2018-09-12 DIAGNOSIS — T50904A Poisoning by unspecified drugs, medicaments and biological substances, undetermined, initial encounter: Secondary | ICD-10-CM | POA: Insufficient documentation

## 2018-09-12 DIAGNOSIS — F10929 Alcohol use, unspecified with intoxication, unspecified: Secondary | ICD-10-CM | POA: Insufficient documentation

## 2018-09-12 DIAGNOSIS — F1994 Other psychoactive substance use, unspecified with psychoactive substance-induced mood disorder: Secondary | ICD-10-CM | POA: Diagnosis present

## 2018-09-12 DIAGNOSIS — F1110 Opioid abuse, uncomplicated: Secondary | ICD-10-CM | POA: Diagnosis present

## 2018-09-12 LAB — CBC WITH DIFFERENTIAL/PLATELET
Abs Immature Granulocytes: 0.03 10*3/uL (ref 0.00–0.07)
Basophils Absolute: 0 10*3/uL (ref 0.0–0.1)
Basophils Relative: 0 %
Eosinophils Absolute: 0.1 10*3/uL (ref 0.0–0.5)
Eosinophils Relative: 1 %
HCT: 45.5 % (ref 39.0–52.0)
Hemoglobin: 15 g/dL (ref 13.0–17.0)
Immature Granulocytes: 0 %
Lymphocytes Relative: 38 %
Lymphs Abs: 3.2 10*3/uL (ref 0.7–4.0)
MCH: 32.3 pg (ref 26.0–34.0)
MCHC: 33 g/dL (ref 30.0–36.0)
MCV: 98.1 fL (ref 80.0–100.0)
Monocytes Absolute: 0.5 10*3/uL (ref 0.1–1.0)
Monocytes Relative: 6 %
Neutro Abs: 4.6 10*3/uL (ref 1.7–7.7)
Neutrophils Relative %: 55 %
Platelets: 178 10*3/uL (ref 150–400)
RBC: 4.64 MIL/uL (ref 4.22–5.81)
RDW: 13.2 % (ref 11.5–15.5)
WBC: 8.4 10*3/uL (ref 4.0–10.5)
nRBC: 0 % (ref 0.0–0.2)

## 2018-09-12 LAB — COMPREHENSIVE METABOLIC PANEL
ALT: 46 U/L — ABNORMAL HIGH (ref 0–44)
ALT: 44 U/L (ref 0–44)
AST: 48 U/L — ABNORMAL HIGH (ref 15–41)
AST: 66 U/L — ABNORMAL HIGH (ref 15–41)
Albumin: 4.3 g/dL (ref 3.5–5.0)
Albumin: 4 g/dL (ref 3.5–5.0)
Alkaline Phosphatase: 51 U/L (ref 38–126)
Alkaline Phosphatase: 49 U/L (ref 38–126)
Anion gap: 11 (ref 5–15)
Anion gap: 9 (ref 5–15)
BUN: 13 mg/dL (ref 6–20)
BUN: 10 mg/dL (ref 6–20)
CO2: 25 mmol/L (ref 22–32)
CO2: 25 mmol/L (ref 22–32)
Calcium: 8.9 mg/dL (ref 8.9–10.3)
Calcium: 8.5 mg/dL — ABNORMAL LOW (ref 8.9–10.3)
Chloride: 106 mmol/L (ref 98–111)
Chloride: 108 mmol/L (ref 98–111)
Creatinine, Ser: 0.88 mg/dL (ref 0.61–1.24)
Creatinine, Ser: 0.84 mg/dL (ref 0.61–1.24)
GFR calc Af Amer: >60 mL/min (ref >60)
GFR calc Af Amer: >60 mL/min (ref >60)
GFR calc non Af Amer: >60 mL/min (ref >60)
GFR calc non Af Amer: >60 mL/min (ref >60)
Glucose, Bld: 132 mg/dL — ABNORMAL HIGH (ref 70–99)
Glucose, Bld: 83 mg/dL (ref 70–99)
Potassium: 3.3 mmol/L — ABNORMAL LOW (ref 3.5–5.1)
Potassium: 4.5 mmol/L (ref 3.5–5.1)
Sodium: 142 mmol/L (ref 135–145)
Sodium: 142 mmol/L (ref 135–145)
Total Bilirubin: 0.6 mg/dL (ref 0.3–1.2)
Total Bilirubin: 0.8 mg/dL (ref 0.3–1.2)
Total Protein: 7.7 g/dL (ref 6.5–8.1)
Total Protein: 7 g/dL (ref 6.5–8.1)

## 2018-09-12 LAB — CBC
HCT: 45.8 % (ref 39.0–52.0)
Hemoglobin: 15.7 g/dL (ref 13.0–17.0)
MCH: 32.5 pg (ref 26.0–34.0)
MCHC: 34.3 g/dL (ref 30.0–36.0)
MCV: 94.8 fL (ref 80.0–100.0)
Platelets: 214 10*3/uL (ref 150–400)
RBC: 4.83 MIL/uL (ref 4.22–5.81)
RDW: 13.1 % (ref 11.5–15.5)
WBC: 9.6 10*3/uL (ref 4.0–10.5)
nRBC: 0 % (ref 0.0–0.2)

## 2018-09-12 LAB — ACETAMINOPHEN LEVEL
Acetaminophen (Tylenol), Serum: <10 ug/mL — ABNORMAL LOW (ref 10–30)
Acetaminophen (Tylenol), Serum: <10 ug/mL — ABNORMAL LOW (ref 10–30)

## 2018-09-12 LAB — SALICYLATE LEVEL
Salicylate Lvl: <7 mg/dL (ref 2.8–30.0)
Salicylate Lvl: <7 mg/dL (ref 2.8–30.0)

## 2018-09-12 LAB — ETHANOL
Alcohol, Ethyl (B): 208 mg/dL — ABNORMAL HIGH (ref <10)
Alcohol, Ethyl (B): 152 mg/dL — ABNORMAL HIGH (ref <10)

## 2018-09-12 LAB — TROPONIN I: Troponin I: <0.03 ng/mL (ref <0.03)

## 2018-09-12 MED ORDER — SODIUM CHLORIDE 0.9 % IV SOLN
1000.0000 mL | Freq: Once | INTRAVENOUS | Status: AC
  Administered 2018-09-12: 18:00:00 1000 mL via INTRAVENOUS

## 2018-09-12 MED ORDER — NALOXONE HCL 2 MG/2ML IJ SOSY
2.0000 mg | PREFILLED_SYRINGE | Freq: Once | INTRAMUSCULAR | Status: AC
  Administered 2018-09-12: 18:00:00 2 mg via INTRAVENOUS
  Filled 2018-09-12: qty 2

## 2018-09-12 NOTE — ED Notes (Signed)
Pt pulled out his IV  

## 2018-09-12 NOTE — ED Notes (Signed)
Pt dressed out in wine colored scrubs and all cables removed from room as EDP Williams verbal that pt is medically cleared. Belongings placed in pt bag and with security.

## 2018-09-12 NOTE — ED Notes (Signed)
Grn/red/lav tubes sent to lab.

## 2018-09-12 NOTE — ED Notes (Addendum)
Clayton Oconnell verbal okay to hold off on IV for now. States no current need. Pt/room will be fully dressed out once medically cleared.

## 2018-09-12 NOTE — BH Assessment (Cosign Needed)
Clayton Oconnell is a 28 y.o. male with a history of major depressive disorder, substance abuse who presents to the ED for overdose.  This patient was seen by this provider on 09/11/2018 and was discharged today.  During his last assessment patient was adamant on taking any psychiatric medications.  He voiced that "I currently have three therapist at [facility] that I am working with, and I do not want to get on any psych medications."  Today this provider went to assess the patient again and he is currently asleep and is unable to be aroused.  Provider will make another attempt later this shift.

## 2018-09-12 NOTE — ED Notes (Signed)
Pt wakes to this RN's voice. Denies pain or any needs. Snoring upon entrance to room. Calm and cooperative.

## 2018-09-12 NOTE — ED Triage Notes (Signed)
Pt in via EMS from friends house d/t AMS/OD. Pt has pinpoint pupils, shallow breathing, friends state they tried to serve IVC papers yesterday without success, VSS per EMS, pt currently alert, BG 97 per EMS.

## 2018-09-12 NOTE — ED Triage Notes (Addendum)
Patient ambulatory to triage with steady gait, without difficulty or distress noted, in custody of [place] [employer] for IVC; papers indicate pt with depression and called family for help; pt denies any SI or HI; st "I got a little drunk, I called my grandma and she got upset and called the cops"

## 2018-09-12 NOTE — ED Notes (Signed)

## 2018-09-12 NOTE — ED Notes (Signed)
Pt sleeping. Rails up. Bed in lowest position. Security outside pt's room.  

## 2018-09-12 NOTE — ED Notes (Signed)
Pt awake and calm. Resting in bed comfortably. Rails up. Bed in lowest position. Security remains outside pt's room.

## 2018-09-12 NOTE — ED Notes (Signed)
Pt removed IV from arm and place all wires from monitor in toilet. Pt arm cleaned up and fluid cleaned off floor wires retrieved from toilet.

## 2018-09-12 NOTE — ED Notes (Signed)
Pt sleeping peacefully. Chest rise and fall noted. Security remains outside pt's room. Rails up. Bed in lowest position.

## 2018-09-12 NOTE — Consult Note (Signed)
[facility] Face-to-Face Psychiatry Consult   Reason for Consult:  Dr. Manson Passey Referring Physician: Alcohol intoxication Patient Identification: Clayton Oconnell MRN:  [id number] Principal Diagnosis: <principal problem not specified> Diagnosis:  Active Problems:   MDD (major depressive disorder), recurrent episode, moderate (HCC)   Total Time spent with patient: 1 hour  Subjective: " Me and my grandmother got into an argument today and she called the cops on me and had me IVC." Clayton Oconnell is a 28 y.o. male patient presented to [facility] ED via law enforcement under involuntary commitment status (IVC).  The patient states, he was recently released from jail due to DUI charge the past few years.  He discussed "I currently live with my grandmother and we got into an argument today due to my drinking and placed the IVC charge on me."  The patient discussed that in 2008 he was in skateboarding accident which he was injured with traumatic brain injury (TB I).  "I currently see 3 therapists at [facility]".the patient discussed that he does not want to take any psychiatric medications.  To night his blood alcohol level was 208 mg/dl.  The patient was seen face-to-face by this provider; chart reviewed and consulted with Dr. Manson Passey on 09/12/2018 due to the care of the patient. It was discussed with the provider that the patient does not meet criteria to be admitted to the inpatient unit.  On evaluation the patient is alert and oriented x4, calm and cooperative, and mood-congruent with affect.  The patient does not appear to be responding to internal or external stimuli. Neither is the patient presenting with any delusional thinking. The patient denies auditory or visual hallucinations. The patient denies any suicidal, homicidal, or self-harm ideations. The patient is not presenting with any psychotic or paranoid behaviors. During an encounter with the patient, he was able to answer questions appropriately. Collateral  was not obtained due to the time of the morning.  The patient grandmother Ms. Margarito Courser [phone]).  The patient is adamant about having someone call his grandmother.  He voiced that she is going to say derogatory things about him.  It was discussed that [facility] psychiatric unit have to collaborate with someone outside to make sure he is not a danger to himself or others. The patient IVC has been rescinded and Dr. Manson Passey is in agreement with discharging him when he is no longer intoxicated and communication has been made with his grandmother. Plan: The patient is not a safety risk to self or others and does not require psychiatric inpatient admission for stabilization and treatment.  HPI:   Past Psychiatric History:  Substance use disorder (HCC) Major depressive disorder (MDD)  Risk to Self:  No Risk to Others:  No Prior Inpatient Therapy:  No Prior Outpatient Therapy:  Yes  Past Medical History: History reviewed. No pertinent past medical history.  Past Surgical History:  Procedure Laterality Date  . FRACTURE SURGERY     Family History: No family history on file. Family Psychiatric  History: Patient states mom's side of the family undiagnosed Social History:  Social History   Substance and Sexual Activity  Alcohol Use Yes  . Alcohol/week: 1.0 standard drinks  . Types: 1 Cans of beer per week  . Frequency: Never     Social History   Substance and Sexual Activity  Drug Use Yes  . Types: Cocaine, Marijuana   Comment: heroin    Social History   Socioeconomic History  . Marital status: Single  Spouse name: Not on file  . Number of children: Not on file  . Years of education: Not on file  . Highest education level: Not on file  Occupational History  . Not on file  Social Needs  . Financial resource strain: Not on file  . Food insecurity:    Worry: Not on file    Inability: Not on file  . Transportation needs:    Medical: Not on file    Non-medical: Not on  file  Tobacco Use  . Smoking status: Current Every Day Smoker    Packs/day: 1.00    Years: 10.00    Pack years: 10.00    Types: Cigarettes    Last attempt to quit: 01/24/2017    Years since quitting: 1.6  . Smokeless tobacco: Current User  Substance and Sexual Activity  . Alcohol use: Yes    Alcohol/week: 1.0 standard drinks    Types: 1 Cans of beer per week    Frequency: Never  . Drug use: Yes    Types: Cocaine, Marijuana    Comment: heroin  . Sexual activity: Not on file  Lifestyle  . Physical activity:    Days per week: Not on file    Minutes per session: Not on file  . Stress: Not on file  Relationships  . Social connections:    Talks on phone: Not on file    Gets together: Not on file    Attends religious service: Not on file    Active member of club or organization: Not on file    Attends meetings of clubs or organizations: Not on file    Relationship status: Not on file  Other Topics Concern  . Not on file  Social History Narrative  . Not on file   Additional Social History:    Allergies:   Allergies  Allergen Reactions  . Tylenol [Acetaminophen] Other (See Comments)    Abdominal pain    Labs:  Results for orders placed or performed during the hospital encounter of 09/12/18 (from the past 48 hour(s))  Comprehensive metabolic panel     Status: Abnormal   Collection Time: 09/12/18  1:36 AM  Result Value Ref Range   Sodium 142 135 - 145 mmol/L   Potassium 3.3 (L) 3.5 - 5.1 mmol/L   Chloride 106 98 - 111 mmol/L   CO2 25 22 - 32 mmol/L   Glucose, Bld 132 (H) 70 - 99 mg/dL   BUN 13 6 - 20 mg/dL   Creatinine, Ser 1.61 0.61 - 1.24 mg/dL   Calcium 8.9 8.9 - 09.6 mg/dL   Total Protein 7.7 6.5 - 8.1 g/dL   Albumin 4.3 3.5 - 5.0 g/dL   AST 48 (H) 15 - 41 U/L   ALT 46 (H) 0 - 44 U/L   Alkaline Phosphatase 51 38 - 126 U/L   Total Bilirubin 0.6 0.3 - 1.2 mg/dL   GFR calc non Af Amer >60 >60 mL/min   GFR calc Af Amer >60 >60 mL/min   Anion gap 11 5 - 15     Comment: Performed at [facility], [address], [place], [place] [address]  Ethanol     Status: Abnormal   Collection Time: 09/12/18  1:36 AM  Result Value Ref Range   Alcohol, Ethyl (B) 208 (H) <10 mg/dL    Comment: (NOTE) Lowest detectable limit for serum alcohol is 10 mg/dL. For medical purposes only. Performed at [facility], [address] [place],  [place] [address]   Salicylate level     Status: None   Collection Time: 09/12/18  1:36 AM  Result Value Ref Range   Salicylate Lvl <7.0 2.8 - 30.0 mg/dL    Comment: Performed at [facility], [address] [place], [place] [address]  Acetaminophen level     Status: Abnormal   Collection Time: 09/12/18  1:36 AM  Result Value Ref Range   Acetaminophen (Tylenol), Serum <10 (L) 10 - 30 ug/mL    Comment: (NOTE) Therapeutic concentrations vary significantly. A range of 10-30 ug/mL  may be an effective concentration for many patients. However, some  are best treated at concentrations outside of this range. Acetaminophen concentrations >150 ug/mL at 4 hours after ingestion  and >50 ug/mL at 12 hours after ingestion are often associated with  toxic reactions. Performed at [facility], [address] [place], [place] [address]   cbc     Status: None   Collection Time: 09/12/18  1:36 AM  Result Value Ref Range   WBC 9.6 4.0 - 10.5 K/uL   RBC 4.83 4.22 - 5.81 MIL/uL   Hemoglobin 15.7 13.0 - 17.0 g/dL   HCT 00.7 62.2 - 63.3 %   MCV 94.8 80.0 - 100.0 fL   MCH 32.5 26.0 - 34.0 pg   MCHC 34.3 30.0 - 36.0 g/dL   RDW 35.4 56.2 - 56.3 %   Platelets 214 150 - 400 K/uL   nRBC 0.0 0.0 - 0.2 %    Comment: Performed at [facility], [address] [place], [place] [address]    No current facility-administered medications for this encounter.    No current outpatient medications on file.    Musculoskeletal: Strength & Muscle Tone: within normal limits Gait & Station:  normal Patient leans: N/A  Psychiatric Specialty Exam: Physical Exam  Nursing note and vitals reviewed. Constitutional: He is oriented to person, place, and time. He appears well-developed and well-nourished.  HENT:  Head: Normocephalic and atraumatic.  Eyes: Pupils are equal, round, and reactive to light. Conjunctivae and EOM are normal.  Neck: Normal range of motion. Neck supple.  Cardiovascular: Normal rate and regular rhythm.  Respiratory: Effort normal and breath sounds normal.  Musculoskeletal: Normal range of motion.  Neurological: He is alert and oriented to person, place, and time. He has normal reflexes.  Skin: Skin is warm and dry.  Psychiatric: Judgment and thought content normal.    Review of Systems  Psychiatric/Behavioral: Positive for depression and substance abuse.  All other systems reviewed and are negative.   Blood pressure (!) 146/86, pulse 86, temperature 98 F (36.7 C), temperature source Oral, resp. rate 20, height 6\' 4"  (1.93 m), weight 95.3 kg, SpO2 97 %.Body mass index is 25.56 kg/m.  General Appearance: Fairly Groomed  Eye Contact:  Good  Speech:  Clear and Coherent  Volume:  Normal  Mood:  Anxious and Depressed  Affect:  Appropriate and Congruent  Thought Process:  Coherent and Goal Directed  Orientation:  Full (Time, Place, and Person)  Thought Content:  Logical  Suicidal Thoughts:  No  Homicidal Thoughts:  No  Memory:  Immediate;   Good Recent;   Good  Judgement:  Fair  Insight:  Fair  Psychomotor Activity:  Normal  Concentration:  Concentration: Good and Attention Span: Good  Recall:  Good  Fund of Knowledge:  Good  Language:  Good  Akathisia:  NA  Handed:  Right  AIMS (if indicated):  Assets:  Desire for Improvement Housing Social Support  ADL's:  Intact  Cognition:  WNL  Sleep:        Treatment Plan Summary: Daily contact with patient to assess and evaluate symptoms and progress in treatment and Plan The patient does not  meet criteria for inpatient psychiatric admission  Disposition: No evidence of imminent risk to self or others at present.   Patient does not meet criteria for psychiatric inpatient admission. Supportive therapy provided about ongoing stressors.  Catalina GravelJacqueline Thomspon, NP 09/12/2018 3:53 AM

## 2018-09-12 NOTE — BH Assessment (Signed)
TTS received a phone call from Darel Hong.  She provided the following information: "He had a late presentation brain injury.  He is is a heavy seasoned drug user and threatens to kills himself on a regular basis.  He does not need to be released."  TTS inquired if the patient has ever attempted suicide and Revonda Standard responded "We don't know.  We don't know if he overdoses on purpose or if it's an accident.  He was in jail and he just got out.  You have to understand, we are not people who interact with the police.  He was a beautiful, brilliant child."   "He was sober for quite a lengthy time - three years as far as we know.  Right after he turned 26 and dropped off my insurance here we go again.  He scares me, he's very large and he's very angry.  I'm concerned about him being at my mother's house."   TTS informed Ms. Jennette Kettle that we are not in a position to mandate drug treatment.  She expressed concern about the patient's TBI and tat he needs treatment for that.  TTS suggested that is likely a medical issue rather than a psychiatric one.  Ms. Jennette Kettle is concerned that the patient will be released and is asking that the EDP telephone her if they need additional information.  Her contact information: Darel Hong [phone]  (about 9 minutes)

## 2018-09-12 NOTE — ED Notes (Signed)
EKG completed

## 2018-09-12 NOTE — ED Provider Notes (Signed)
[facility] Emergency Department Provider Note    First MD Initiated Contact with Patient 09/12/18 0225     (approximate)  I have reviewed the triage vital signs and the nursing notes.   HISTORY  Chief Complaint Mental Health Problem    HPI Clayton Oconnell is a 28 y.o. male presents to the emergency department involuntarily committed secondary to alcohol ingestion and heroin use tonight with associated depression and verbal altercation with his grandmother.  Patient denies any suicidal ideation.  Patient denies any homicidal ideation.  Patient states that he "got drunk and snorted some heroin".  Patient states that he was mostly upset with himself as he has been sober for some time.        History reviewed. No pertinent past medical history.  Patient Active Problem List   Diagnosis Date Noted  . MDD (major depressive disorder), recurrent episode, moderate (HCC) 09/12/2018    Past Surgical History:  Procedure Laterality Date  . FRACTURE SURGERY      Prior to Admission medications   Not on File    Allergies Tylenol [acetaminophen]  No family history on file.  Social History Social History   Tobacco Use  . Smoking status: Current Every Day Smoker    Packs/day: 1.00    Years: 10.00    Pack years: 10.00    Types: Cigarettes    Last attempt to quit: 01/24/2017    Years since quitting: 1.6  . Smokeless tobacco: Current User  Substance Use Topics  . Alcohol use: Yes    Alcohol/week: 1.0 standard drinks    Types: 1 Cans of beer per week    Frequency: Never  . Drug use: Yes    Types: Cocaine, Marijuana    Comment: heroin    Review of Systems Constitutional: No fever/chills Eyes: No visual changes. ENT: No sore throat. Cardiovascular: Denies chest pain. Respiratory: Denies shortness of breath. Gastrointestinal: No abdominal pain.  No nausea, no vomiting.  No diarrhea.  No constipation. Genitourinary: Negative for dysuria.  Musculoskeletal: Negative for neck pain.  Negative for back pain. Integumentary: Negative for rash. Neurological: Negative for headaches, focal weakness or numbness. Psychiatric:  Positive for EtOH and heroin use tonight   ____________________________________________   PHYSICAL EXAM:  VITAL SIGNS: ED Triage Vitals  Enc Vitals Group     BP 09/12/18 0128 (!) 146/86     Pulse Rate 09/12/18 0128 86     Resp 09/12/18 0128 20     Temp 09/12/18 0128 98 F (36.7 C)     Temp Source 09/12/18 0128 Oral     SpO2 09/12/18 0128 97 %     Weight 09/12/18 0123 95.3 kg (210 lb)     Height 09/12/18 0123 1.93 m (6\' 4" )     Head Circumference --      Peak Flow --      Pain Score 09/12/18 0125 0     Pain Loc --      Pain Edu? --      Excl. in GC? --     Constitutional: Alert and oriented.  Appears intoxicated Eyes: Conjunctivae are normal. Head: Atraumatic. Mouth/Throat: Mucous membranes are moist.  Oropharynx non-erythematous. Neck: No stridor.   Cardiovascular: Normal rate, regular rhythm. Good peripheral circulation. Grossly normal heart sounds. Respiratory: Normal respiratory effort.  No retractions. No audible wheezing. Gastrointestinal: Soft and nontender. No distention.  Musculoskeletal: No lower extremity tenderness nor edema. No gross deformities of extremities. Neurologic:  Normal speech  and language. No gross focal neurologic deficits are appreciated.  Skin:  Skin is warm, dry and intact. No rash noted. Psychiatric: Appears intoxicated speech and behavior are normal.  ____________________________________________   LABS (all labs ordered are listed, but only abnormal results are displayed)  Labs Reviewed  COMPREHENSIVE METABOLIC PANEL - Abnormal; Notable for the following components:      Result Value   Potassium 3.3 (*)    Glucose, Bld 132 (*)    AST 48 (*)    ALT 46 (*)    All other components within normal limits  ETHANOL - Abnormal; Notable for the following  components:   Alcohol, Ethyl (B) 208 (*)    All other components within normal limits  ACETAMINOPHEN LEVEL - Abnormal; Notable for the following components:   Acetaminophen (Tylenol), Serum <10 (*)    All other components within normal limits  SALICYLATE LEVEL  CBC  URINE DRUG SCREEN, QUALITATIVE ([facility] ONLY)     Procedures   ____________________________________________   INITIAL IMPRESSION / MDM / ASSESSMENT AND PLAN / ED COURSE  As part of my medical decision making, I reviewed the following data within the electronic MEDICAL RECORD NUMBER     28 year old male presenting with above-stated history and physical exam secondary to polysubstance use tonight following verbal argument with his grandmother who involuntarily committed him.  Patient denies any suicidal ideation or homicidal ideation.  Patient evaluated by the psychiatry staff who reversed the patient's involuntary commitment and recommended discharge.  I offered detox to the patient however he states that [facility]    ____________________________________________  FINAL CLINICAL IMPRESSION(S) / ED DIAGNOSES  Final diagnoses:  Polysubstance abuse (HCC)     MEDICATIONS GIVEN DURING THIS VISIT:  Medications - No data to display   ED Discharge Orders    None       Note:  This document was prepared using Dragon voice recognition software and may include unintentional dictation errors.   Darci CurrentBrown, [place] N, MD 09/12/18 0630

## 2018-09-12 NOTE — ED Notes (Signed)
Pt asleep with blanket over head, door open, rails up, bed in lowest position, pt keeps pulling off heart monitor and other equipment.

## 2018-09-12 NOTE — ED Notes (Signed)
Door open. Rails up. Bed in lowest position. Call bell within reach. Security right outside room.

## 2018-09-12 NOTE — ED Notes (Signed)
Patient observed lying in bed with eyes closed  Even, unlabored respirations observed   NAD pt appears to be sleeping  I will continue to monitor along with every 15 minute visual observations and ongoing security monitoring    

## 2018-09-12 NOTE — ED Notes (Signed)
Pt continues to sleep. Rails up. Bed in lowest position. Security outside pt's room.

## 2018-09-12 NOTE — ED Provider Notes (Signed)
[facility] Emergency Department Provider Note       Time seen: ----------------------------------------- 6:35 PM on 09/12/2018 -----------------------------------------   I have reviewed the triage vital signs and the nursing notes.  HISTORY   Chief Complaint Drug Overdose    HPI Clayton Oconnell is a 28 y.o. male with a history of major depressive disorder, substance abuse who presents to the ED for overdose.  Patient arrives by EMS from a friend's house due to altered mental status thought to be from an overdose.  Patient had pinpoint pupils and shallow breathing, friend states they tried to serve IVC papers on him last night.  He was given Narcan intranasally in route by EMS.  History reviewed. No pertinent past medical history.  Patient Active Problem List   Diagnosis Date Noted  . MDD (major depressive disorder), recurrent episode, moderate (HCC) 09/12/2018    Past Surgical History:  Procedure Laterality Date  . FRACTURE SURGERY      Allergies Tylenol [acetaminophen]  Social History Social History   Tobacco Use  . Smoking status: Current Every Day Smoker    Packs/day: 1.00    Years: 10.00    Pack years: 10.00    Types: Cigarettes    Last attempt to quit: 01/24/2017    Years since quitting: 1.6  . Smokeless tobacco: Current User  Substance Use Topics  . Alcohol use: Yes    Alcohol/week: 1.0 standard drinks    Types: 1 Cans of beer per week    Frequency: Never  . Drug use: Yes    Types: Cocaine, Marijuana    Comment: heroin   Review of Systems Constitutional: Negative for fever. Cardiovascular: Negative for chest pain. Respiratory: Negative for shortness of breath. Gastrointestinal: Negative for abdominal pain, vomiting and diarrhea. Musculoskeletal: Negative for back pain. Skin: Negative for rash. Neurological: Negative for headaches, focal weakness or numbness. Psychiatric: Positive for substance abuse  All systems  negative/normal/unremarkable except as stated in the HPI  ____________________________________________   PHYSICAL EXAM:  VITAL SIGNS: ED Triage Vitals  Enc Vitals Group     BP 09/12/18 1730 (!) 121/92     Pulse Rate 09/12/18 1733 69     Resp 09/12/18 1733 17     Temp 09/12/18 1733 97.8 F (36.6 C)     Temp Source 09/12/18 1733 Oral     SpO2 09/12/18 1733 99 %     Weight 09/12/18 1734 212 lb (96.2 kg)     Height 09/12/18 1734 6' (1.829 m)     Head Circumference --      Peak Flow --      Pain Score 09/12/18 1734 0     Pain Loc --      Pain Edu? --      Excl. in GC? --     Constitutional: Alert but drowsy, no distress Eyes: Conjunctivae are normal. Normal extraocular movements. ENT      Head: Normocephalic and atraumatic.      Nose: No congestion/rhinnorhea.      Mouth/Throat: Mucous membranes are moist.      Neck: No stridor. Cardiovascular: Normal rate, regular rhythm. No murmurs, rubs, or gallops. Respiratory: Normal respiratory effort without tachypnea nor retractions. Breath sounds are clear and equal bilaterally. No wheezes/rales/rhonchi. Gastrointestinal: Soft and nontender. Normal bowel sounds Musculoskeletal: Nontender with normal range of motion in extremities. No lower extremity tenderness nor edema. Neurologic:  Normal speech and language. No gross focal neurologic deficits are appreciated.  Skin:  Skin  is warm, dry and intact. No rash noted. Psychiatric: Hostile mood and affect at times ____________________________________________  ED COURSE:  As part of my medical decision making, I reviewed the following data within the electronic MEDICAL RECORD NUMBER History obtained from family if available, nursing notes, old chart and ekg, as well as notes from prior ED visits. Patient presented for overdose, we will assess with labs and imaging as indicated at this time.   Procedures  RAEQWON MCCULLAR Oconnell was evaluated in Emergency Department on 09/12/2018 for the  symptoms described in the history of present illness. He was evaluated in the context of the global COVID-19 pandemic, which necessitated consideration that the patient might be at risk for infection with the SARS-CoV-2 virus that causes COVID-19. Institutional protocols and algorithms that pertain to the evaluation of patients at risk for COVID-19 are in a state of rapid change based on information released by regulatory bodies including the CDC and federal and state organizations. These policies and algorithms were followed during the patient's care in the ED.  ____________________________________________   LABS (pertinent positives/negatives)  Labs Reviewed  COMPREHENSIVE METABOLIC PANEL - Abnormal; Notable for the following components:      Result Value   Calcium 8.5 (*)    AST 66 (*)    All other components within normal limits  ACETAMINOPHEN LEVEL - Abnormal; Notable for the following components:   Acetaminophen (Tylenol), Serum <10 (*)    All other components within normal limits  ETHANOL - Abnormal; Notable for the following components:   Alcohol, Ethyl (B) 152 (*)    All other components within normal limits  CBC WITH DIFFERENTIAL/PLATELET  SALICYLATE LEVEL  TROPONIN I  URINALYSIS, COMPLETE (UACMP) WITH MICROSCOPIC  URINE DRUG SCREEN, QUALITATIVE ([facility] ONLY)  CBG MONITORING, ED   ____________________________________________   DIFFERENTIAL DIAGNOSIS   Substance abuse, intentional overdose, major depressive disorder  FINAL ASSESSMENT AND PLAN  Substance abuse, overdose   Plan: The patient had presented for recurrent overdoses.  Patient was seen here last night for similar and was subsequently let go home.  Patient's labs did not reveal any acute process other than intoxication.  He appears medically clear for psychiatric evaluation and disposition.   Ulice Dash, MD    Note: This note was generated in part or whole with voice recognition software. Voice  recognition is usually quite accurate but there are transcription errors that can and very often do occur. I apologize for any typographical errors that were not detected and corrected.     Emily Filbert, MD 09/12/18 [place]

## 2018-09-12 NOTE — BH Assessment (Signed)
TTS received phone number for patient's mother at handoff: Candice Camp [phone] if/when patient agrees for [place] to talk with her.
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
## 2018-09-12 NOTE — ED Notes (Signed)
Pt remains asleep with blanket over head. Will wake to this RN's voice or touch. Denies any needs at this time. Security remains right outside pt's room. Door open. Lights dimmed. Rails up. Bed in lowest position.

## 2018-09-13 DIAGNOSIS — F1110 Opioid abuse, uncomplicated: Secondary | ICD-10-CM

## 2018-09-13 DIAGNOSIS — F1994 Other psychoactive substance use, unspecified with psychoactive substance-induced mood disorder: Secondary | ICD-10-CM | POA: Diagnosis present

## 2018-09-13 LAB — URINE DRUG SCREEN, QUALITATIVE (ARMC ONLY)
Amphetamines, Ur Screen: NOT DETECTED
Barbiturates, Ur Screen: NOT DETECTED
Benzodiazepine, Ur Scrn: POSITIVE — AB
Cannabinoid 50 Ng, Ur ~~LOC~~: POSITIVE — AB
Cocaine Metabolite,Ur ~~LOC~~: NOT DETECTED
MDMA (Ecstasy)Ur Screen: NOT DETECTED
Methadone Scn, Ur: NOT DETECTED
Opiate, Ur Screen: POSITIVE — AB
Phencyclidine (PCP) Ur S: NOT DETECTED
Tricyclic, Ur Screen: NOT DETECTED

## 2018-09-13 LAB — URINALYSIS, COMPLETE (UACMP) WITH MICROSCOPIC
Bacteria, UA: NONE SEEN
Bilirubin Urine: NEGATIVE
Glucose, UA: NEGATIVE mg/dL
Hgb urine dipstick: NEGATIVE
Ketones, ur: NEGATIVE mg/dL
Leukocytes,Ua: NEGATIVE
Nitrite: NEGATIVE
Protein, ur: NEGATIVE mg/dL
Specific Gravity, Urine: 1.008 (ref 1.005–1.030)
Squamous Epithelial / HPF: NONE SEEN (ref 0–5)
pH: 6 (ref 5.0–8.0)

## 2018-09-13 NOTE — ED Notes (Signed)
Pt resting calmly in bed. Rails up. Bed in lowest position. Security outside pt's room. Pt continues to refuse to provide urine sample.

## 2018-09-13 NOTE — ED Notes (Signed)
Pt continues to sleep. Chest rise and fall noted. Rails up. Bed in lowest position. Security outside pt's room.

## 2018-09-13 NOTE — ED Notes (Signed)
Patient standing in the hallway at this time

## 2018-09-13 NOTE — ED Notes (Signed)
Pt sleeping. Rails up. Bed in lowest position. Security outside pt's room.

## 2018-09-13 NOTE — Consult Note (Signed)
[facility] Psych ED Discharge  09/13/2018 10:49 AM Tiffany Kocher III  MRN:  [id number] Principal Problem: Substance induced mood disorder [facility]) Discharge Diagnoses: Principal Problem:   Substance induced mood disorder (HCC) Active Problems:   Heroin abuse [facility])  Subjective: 28 yo male who presented to the ED after he fell asleep on his friends' couch.  When they could not awaken him, they called 911.  Denies any suicide attempt or suicidal ideations.  Does use heroin and other substances to get "high".  No homicidal ideations, hallucinations, or withdrawal symptoms.  Substance abuse increases when he has money and decreases when he works.  He wants to leave and go to work at 1 pm, [employer].  Provided his boss, Cindee Lame, and Christine's (like a second mother) phone number for collateral information.  Pete answered and had no safety concerns regarding [place].  He has worked with him for a few months and is scheduled to work at 1 pm today.  Wynona Canes was contacted but did not answer, voicemail left.  Patient ate breakfast this morning and watching television without issues.   Total Time spent with patient: 30 minutes  Past Psychiatric History: polysubstance abuse  Past Medical History: History reviewed. No pertinent past medical history.  Past Surgical History:  Procedure Laterality Date  . FRACTURE SURGERY     Family History: History reviewed. No pertinent family history. Family Psychiatric  History: none Social History:  Social History   Substance and Sexual Activity  Alcohol Use Yes  . Alcohol/week: 1.0 standard drinks  . Types: 1 Cans of beer per week  . Frequency: Never     Social History   Substance and Sexual Activity  Drug Use Yes  . Types: Cocaine, Marijuana   Comment: heroin    Social History   Socioeconomic History  . Marital status: Single    Spouse name: Not on file  . Number of children: Not on file  . Years of education: Not on file  . Highest  education level: Not on file  Occupational History  . Not on file  Social Needs  . Financial resource strain: Not on file  . Food insecurity:    Worry: Not on file    Inability: Not on file  . Transportation needs:    Medical: Not on file    Non-medical: Not on file  Tobacco Use  . Smoking status: Current Every Day Smoker    Packs/day: 1.00    Years: 10.00    Pack years: 10.00    Types: Cigarettes    Last attempt to quit: 01/24/2017    Years since quitting: 1.6  . Smokeless tobacco: Current User  Substance and Sexual Activity  . Alcohol use: Yes    Alcohol/week: 1.0 standard drinks    Types: 1 Cans of beer per week    Frequency: Never  . Drug use: Yes    Types: Cocaine, Marijuana    Comment: heroin  . Sexual activity: Not on file  Lifestyle  . Physical activity:    Days per week: Not on file    Minutes per session: Not on file  . Stress: Not on file  Relationships  . Social connections:    Talks on phone: Not on file    Gets together: Not on file    Attends religious service: Not on file    Active member of club or organization: Not on file    Attends meetings of clubs or organizations: Not on file  Relationship status: Not on file  Other Topics Concern  . Not on file  Social History Narrative  . Not on file    Has this patient used any form of tobacco in the last 30 days? (Cigarettes, Smokeless Tobacco, Cigars, and/or Pipes) A prescription for an FDA-approved tobacco cessation medication was offered at discharge and the patient refused  Current Medications: No current facility-administered medications for this encounter.    No current outpatient medications on file.   PTA Medications: (Not in a hospital admission)   Musculoskeletal: Strength & Muscle Tone: within normal limits Gait & Station: normal Patient leans: N/A  Psychiatric Specialty Exam: Physical Exam  Nursing note and vitals reviewed. Constitutional: He is oriented to person, place, and  time. He appears well-developed and well-nourished.  HENT:  Head: Normocephalic.  Neck: Normal range of motion.  Respiratory: Effort normal.  Musculoskeletal: Normal range of motion.  Neurological: He is alert and oriented to person, place, and time.  Psychiatric: His speech is normal and behavior is normal. Judgment and thought content normal. His mood appears anxious. Cognition and memory are normal.    Review of Systems  Psychiatric/Behavioral: Positive for substance abuse. The patient is nervous/anxious.   All other systems reviewed and are negative.   Blood pressure (!) 142/92, pulse (!) 51, temperature 97.8 F (36.6 C), temperature source Oral, resp. rate 18, height 6' (1.829 m), weight 96.2 kg, SpO2 100 %.Body mass index is 28.75 kg/m.  General Appearance: Casual  Eye Contact:  Good  Speech:  Normal Rate  Volume:  Normal  Mood:  Anxious  Affect:  Congruent  Thought Process:  Coherent and Descriptions of Associations: Intact  Orientation:  Full (Time, Place, and Person)  Thought Content:  WDL and Logical  Suicidal Thoughts:  No  Homicidal Thoughts:  No  Memory:  Immediate;   Good Recent;   Good Remote;   Good  Judgement:  Fair  Insight:  Fair  Psychomotor Activity:  Normal  Concentration:  Concentration: Good and Attention Span: Good  Recall:  Good  Fund of Knowledge:  Fair  Language:  Good  Akathisia:  No  Handed:  Right  AIMS (if indicated):     Assets:  Housing Leisure Time Physical Health Resilience Social Support Vocational/Educational  ADL's:  Intact  Cognition:  WNL  Sleep:        Demographic Factors:  Male, Adolescent or young adult and Caucasian  Loss Factors: NA  Historical Factors: NA  Risk Reduction Factors:   Sense of responsibility to family, Employed, Living with another person, especially a relative and Positive social support  Continued Clinical Symptoms:  Anxiety, mild  Cognitive Features That Contribute To Risk:  None     Suicide Risk:  Minimal: No identifiable suicidal ideation.  Patients presenting with no risk factors but with morbid ruminations; may be classified as minimal risk based on the severity of the depressive symptoms   Plan Of Care/Follow-up recommendations:  Substance induced mood disorder: -Resources provided, encouraged to return to [facility] for therapy and substance abuse assistance Activity:  as tolerated Diet:  heart healthy diet  Disposition: discharge home Nanine MeansJamison Glenn Christo, NP 09/13/2018, 10:49 AM

## 2018-09-13 NOTE — ED Provider Notes (Signed)
-----------------------------------------   6:58 AM on 09/13/2018 -----------------------------------------   Blood pressure (!) 142/92, pulse (!) 51, temperature 97.8 F (36.6 C), temperature source Oral, resp. rate 18, height 6' (1.829 m), weight 96.2 kg, SpO2 100 %.  The patient is sleeping at this time.  There have been no acute events since the last update.  Awaiting disposition plan from Behavioral Medicine team.   Irean Hong, MD 09/13/18 [phone]

## 2018-09-13 NOTE — ED Notes (Signed)
Pt met with psych and they advised that if pt could have a collateral person to call and contract for the pt safety that they would talk to the attending physician about discharge - the problem they report is that pt was here yesterday and returned for same today - pt reports that he has not lived here since September and that he has "no one to call" and that his grandmother and him are arguing so he cannot call her - this does not correspond with the patient stating that he was at a friends house last night when they were unable to arouse him - re-explained to patient what IVC means - he became agitated and returned to room

## 2018-09-13 NOTE — ED Provider Notes (Signed)
-----------------------------------------   11:52 AM on 09/13/2018 -----------------------------------------  The patient has been evaluated by the behavioral health team.  IVC has been rescinded by Dr. Toni Amend.  The patient is stable for discharge at this time.  Return precautions provided.   Dionne Bucy, MD 09/13/18 1152

## 2018-09-13 NOTE — Discharge Instructions (Addendum)
Accidental Drug Poisoning, Adult °Accidental drug poisoning happens when a person accidentally takes too much of a substance, such as a prescription medicine, an over-the-counter medicine, a vitamin, a supplement, or an illegal drug. The effects of drug poisoning can be mild, dangerous, or even deadly. °What are the causes? °This condition is caused by taking too much of a medicine, illegal drug, or other substance. It often results from: °· Lack of knowledge about a substance. °· Using more than one substance at the same time. °· An error made by the health care provider who prescribed the substance. °· An error made by the pharmacist who filled the prescription. °· A lapse in memory, such as forgetting that you have already taken a dose of the medicine. °· Suddenly using a substance after a long period of not using it. °The following substances and medicines are more likely to cause an accidental drug poisoning: °· Medicines that treat mental problems (psychotropic medicines). °· Pain medicines. °· Cocaine. °· Heroin. °· Multivitamins that contain iron. °· Over-the-counter cold and cough medicines. °What increases the risk? °This condition is more likely to occur in: °· Elderly adults. Elderly adults are at risk because they may: °? Be taking many different medicines. °? Have difficulty reading labels. °? Forget when they last took their medicine. °· People who use illegal drugs. °· People who drink alcohol while using illegal drugs or certain medicines. °· People with certain mental health conditions. °What are the signs or symptoms? °Symptoms of this condition depend on the substance and the amount that was taken. Common symptoms include: °· Behavior changes, such as confusion. °· Sleepiness. °· Weakness. °· Slowed breathing. °· Nausea and vomiting. °· Seizures. °· Very large or small eye pupil size. °A drug poisoning can cause a very serious condition in which your blood pressure drops to a low level (shock).  Symptoms of shock include: °· Cold and clammy skin. °· Pale skin. °· Blue lips. °· Very slow breathing. °· Extreme sleepiness. °· Severe confusion. °· Dizziness or fainting. °How is this diagnosed? °This condition is diagnosed based on: °· Your symptoms. You will be asked about the substances you took and when you took them. °· A physical exam. °You may also have other tests, including: °· Urine tests. °· Blood tests. °· An electrocardiogram (ECG). °How is this treated? °This condition may need to be treated right away at the hospital. Treatment may involve: °· Getting fluids and electrolytes through an IV. °· Having a breathing tube inserted in your airway (endotracheal tube) to help you breathe. °· Taking medicines. These may include medicines that: °? Absorb any substance that is in your digestive system. °? Block or reverse the effect of the substance that caused the drug poisoning. °· Having your blood filtered through an artificial kidney machine (hemodialysis). °· Ongoing counseling and mental health support. This may be provided if you used an illegal drug. °Follow these instructions at home: °Medicines ° °· Take over-the-counter and prescription medicines only as told by your health care provider. °· Before taking a new medicine, ask your health care provider whether the medicine: °? May cause side effects. °? Might react with other medicines. °· Keep a list of all the medicines that you take, including over-the-counter medicines, vitamins, supplements, and herbs. Bring this list with you to all of your medical visits. °General instructions ° °· Drink enough fluid to keep your urine pale yellow. °· If you are working with a counselor or mental health professional, make   sure to follow his or her instructions. °· Do not drink alcohol if: °? Your health care provider tells you not to drink. °? You are pregnant, may be pregnant, or are planning to become pregnant. °· If you drink alcohol, limit how much you  have: °? 0-1 drink a day for women. °? 0-2 drinks a day for men. °· Be aware of how much alcohol is in your drink. In the [place] one drink equals one typical bottle of beer (12 oz), one-half glass of wine (5 oz), or one shot of hard liquor (1½ oz). °· Keep all follow-up visits as told by your health care provider. This is important. °How is this prevented? ° °· Get help if you are struggling with: °? Alcohol or drug use. °? Depression or another mental health problem. °· Keep the phone number of your local poison control center near your phone or on your cell phone. The hotline of the American Association of Poison Control Centers is (800) 222-1222. °· Store all medicines in safety containers that are out of the reach of children. °· Read the drug inserts that come with your medicines. °· Create a system for taking your medicine, such as a pillbox, that will help you avoid taking too much of the medicine. °· Do not drink alcohol while taking medicines unless your health care provider approves. °· Do not use illegal drugs. °· Do not take medicines that are not prescribed for you. °Contact a health care provider if: °· Your symptoms return. °· You develop new symptoms or side effects after taking a medicine. °· You have questions about possible drug poisoning. Call your local poison control center at (800) 222-1222. °Get help right away if: °· You think that you or someone else may have taken too much of a substance. °· You or someone else is having symptoms of drug poisoning. °Summary °· Accidental drug poisoning happens when a person accidentally takes too much of a substance, such as a prescription medicine, an over-the-counter medicine, a vitamin, a supplement, or an illegal drug. °· The effects of drug poisoning can be mild, dangerous, or even deadly. °· This condition is diagnosed based on your symptoms and a physical exam. You will be asked to tell your health care provider which substances you took and when you  took them. °· This condition may need to be treated right away at the hospital. °This information is not intended to replace advice given to you by your health care provider. Make sure you discuss any questions you have with your health care provider. °Document Released: 07/01/2004 Document Revised: 03/19/2017 Document Reviewed: 03/19/2017 °Elsevier Interactive Patient Education © 2019 [employer] ° °

## 2018-09-13 NOTE — ED Notes (Signed)
Pt requesting to see psych staff. Told pt psych staff already dropped by but pt was asleep. Explained psych will attempt to see pt again tonight.

## 2018-09-13 NOTE — ED Notes (Signed)
Pt wakes easily to voice and light touch. Pt denies any needs. Pt curious when psych will be around again to see him. Explained delay and that it may be several hours before pt can be seen. Pt calm and cooperative.

## 2018-09-13 NOTE — ED Notes (Signed)
Pt given breakfast tray with juice - hand hygiene performed by pt prior to eating

## 2018-09-13 NOTE — ED Notes (Signed)
Witnessed pt doing oral care and hand hygiene performed
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
## 2018-09-13 NOTE — ED Notes (Signed)
Spoke with Dr Marisa Severin concerning discharge orders - he reports that he has not been told that IVC was rescinded  Psych MD came and reported that he still needed to see the pt prior to rescinding IVC - awaiting his review

## 2018-09-13 NOTE — ED Notes (Signed)
1 pair of black socks removed from pt and placed in a belongings bag labeled with a pt sticker- pt given nonslip socks to put on

## 2018-11-16 ENCOUNTER — Encounter: Payer: Self-pay | Admitting: Emergency Medicine

## 2018-11-16 ENCOUNTER — Other Ambulatory Visit: Payer: Self-pay

## 2018-11-16 ENCOUNTER — Emergency Department
Admission: EM | Admit: 2018-11-16 | Discharge: 2018-11-17 | Disposition: A | Discharge status: Home / Self Care | Payer: Self-pay | Attending: Emergency Medicine | Admitting: Emergency Medicine

## 2018-11-16 DIAGNOSIS — Y904 Blood alcohol level of 80-99 mg/100 ml: Secondary | ICD-10-CM | POA: Insufficient documentation

## 2018-11-16 DIAGNOSIS — F10920 Alcohol use, unspecified with intoxication, uncomplicated: Secondary | ICD-10-CM | POA: Insufficient documentation

## 2018-11-16 DIAGNOSIS — F1110 Opioid abuse, uncomplicated: Secondary | ICD-10-CM | POA: Insufficient documentation

## 2018-11-16 DIAGNOSIS — T407X1A Poisoning by cannabis (derivatives), accidental (unintentional), initial encounter: Secondary | ICD-10-CM | POA: Insufficient documentation

## 2018-11-16 DIAGNOSIS — T50901A Poisoning by unspecified drugs, medicaments and biological substances, accidental (unintentional), initial encounter: Secondary | ICD-10-CM

## 2018-11-16 DIAGNOSIS — F17210 Nicotine dependence, cigarettes, uncomplicated: Secondary | ICD-10-CM | POA: Insufficient documentation

## 2018-11-16 LAB — URINE DRUG SCREEN, QUALITATIVE (ARMC ONLY)
Amphetamines, Ur Screen: NOT DETECTED
Barbiturates, Ur Screen: NOT DETECTED
Benzodiazepine, Ur Scrn: NOT DETECTED
Cannabinoid 50 Ng, Ur ~~LOC~~: POSITIVE — AB
Cocaine Metabolite,Ur ~~LOC~~: NOT DETECTED
MDMA (Ecstasy)Ur Screen: NOT DETECTED
Methadone Scn, Ur: NOT DETECTED
Opiate, Ur Screen: POSITIVE — AB
Phencyclidine (PCP) Ur S: NOT DETECTED
Tricyclic, Ur Screen: NOT DETECTED

## 2018-11-16 LAB — CBC
HCT: 47.9 % (ref 39.0–52.0)
Hemoglobin: 16.6 g/dL (ref 13.0–17.0)
MCH: 32.5 pg (ref 26.0–34.0)
MCHC: 34.7 g/dL (ref 30.0–36.0)
MCV: 93.7 fL (ref 80.0–100.0)
Platelets: 226 10*3/uL (ref 150–400)
RBC: 5.11 MIL/uL (ref 4.22–5.81)
RDW: 12.3 % (ref 11.5–15.5)
WBC: 10.1 10*3/uL (ref 4.0–10.5)
nRBC: 0 % (ref 0.0–0.2)

## 2018-11-16 LAB — BASIC METABOLIC PANEL
Anion gap: 12 (ref 5–15)
BUN: 12 mg/dL (ref 6–20)
CO2: 25 mmol/L (ref 22–32)
Calcium: 8.7 mg/dL — ABNORMAL LOW (ref 8.9–10.3)
Chloride: 104 mmol/L (ref 98–111)
Creatinine, Ser: 1.08 mg/dL (ref 0.61–1.24)
GFR calc Af Amer: >60 mL/min (ref >60)
GFR calc non Af Amer: >60 mL/min (ref >60)
Glucose, Bld: 112 mg/dL — ABNORMAL HIGH (ref 70–99)
Potassium: 3.6 mmol/L (ref 3.5–5.1)
Sodium: 141 mmol/L (ref 135–145)

## 2018-11-16 LAB — SALICYLATE LEVEL: Salicylate Lvl: <7 mg/dL (ref 2.8–30.0)

## 2018-11-16 LAB — ETHANOL: Alcohol, Ethyl (B): 93 mg/dL — ABNORMAL HIGH (ref <10)

## 2018-11-16 LAB — ACETAMINOPHEN LEVEL: Acetaminophen (Tylenol), Serum: <10 ug/mL — ABNORMAL LOW (ref 10–30)
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
## 2018-11-16 NOTE — ED Notes (Signed)
Pt placed in hospital attire and one pair chamo crocs, one pair tan shorts, one white shirt, and one pair red/white/ boxers placed in labeled bag.

## 2018-11-16 NOTE — ED Provider Notes (Addendum)
[facility] Emergency Department Provider Note   ____________________________________________   First MD Initiated Contact with Patient 11/16/18 2145     (approximate)  I have reviewed the triage vital signs and the nursing notes.   HISTORY  Chief Complaint IVC    HPI NUCHEM GRATTAN III is a 28 y.o. male patient was drinking alcohol and smoking weed.  He did not think he had any opioids around him.  He said he had quit opioids and only relapsed once a month ago.  His drug screen was positive for opioids and he did respond to Narcan.  He admits he could have had opioids in the marijuana.  After over 2 hours he is awake alert oriented.  He initially denied homicidal and suicidal ideation when he got here and denies that again now.  He has been calm and cooperative. History reviewed. No pertinent past medical history.  Patient Active Problem List   Diagnosis Date Noted  . Heroin abuse ([place]) 09/13/2018  . Substance induced mood disorder ([place]) 09/13/2018    Past Surgical History:  Procedure Laterality Date  . FRACTURE SURGERY      Prior to Admission medications   Not on File    Allergies Tylenol [acetaminophen]  No family history on file.  Social History Social History   Tobacco Use  . Smoking status: Current Every Day Smoker    Packs/day: 1.00    Years: 10.00    Pack years: 10.00    Types: Cigarettes    Last attempt to quit: 01/24/2017    Years since quitting: 1.8  . Smokeless tobacco: Current User  Substance Use Topics  . Alcohol use: Yes    Alcohol/week: 1.0 standard drinks    Types: 1 Cans of beer per week    Frequency: Never  . Drug use: Yes    Types: Cocaine, Marijuana    Comment: heroin    Review of Systems  Constitutional: No fever/chills Eyes: No visual changes. ENT: No sore throat. Cardiovascular: Denies chest pain. Respiratory: Denies shortness of breath. Gastrointestinal: No abdominal pain.  No nausea, no  vomiting.  No diarrhea.  No constipation. Genitourinary: Negative for dysuria. Musculoskeletal: Negative for back pain. Skin: Negative for rash. Neurological: Negative for headaches, focal weakness  ____________________________________________   PHYSICAL EXAM:  VITAL SIGNS: ED Triage Vitals [11/16/18 2114]  Enc Vitals Group     BP (!) 145/89     Pulse Rate 95     Resp 20     Temp 98 F (36.7 C)     Temp Source Oral     SpO2 98 %     Weight 210 lb (95.3 kg)     Height 6\' 4"  (1.93 m)     Head Circumference      Peak Flow      Pain Score 0     Pain Loc      Pain Edu?      Excl. in [place]?     Constitutional: Alert and oriented. Well appearing and in no acute distress. Eyes: Conjunctivae are normal.  Head: Atraumatic. Nose: No congestion/rhinnorhea. Mouth/Throat: Mucous membranes are moist.  Oropharynx non-erythematous. Neck: No stridor.  Cardiovascular: Normal rate, regular rhythm. Grossly normal heart sounds.  Good peripheral circulation. Respiratory: Normal respiratory effort.  No retractions. Lungs CTAB. Gastrointestinal: Soft and nontender. No distention. No abdominal bruits. No CVA tenderness. Musculoskeletal: No lower extremity tenderness nor edema.  No joint effusions. Neurologic:  Normal speech and language. No gross  focal neurologic deficits are appreciated.  Skin:  Skin is warm, dry and intact. No rash noted. Psychiatric: Mood and affect are normal. Speech and behavior are normal.  ____________________________________________   LABS (all labs ordered are listed, but only abnormal results are displayed)  Labs Reviewed  ACETAMINOPHEN LEVEL - Abnormal; Notable for the following components:      Result Value   Acetaminophen (Tylenol), Serum <10 (*)    All other components within normal limits  ETHANOL - Abnormal; Notable for the following components:   Alcohol, Ethyl (B) 93 (*)    All other components within normal limits  BASIC METABOLIC PANEL - Abnormal;  Notable for the following components:   Glucose, Bld 112 (*)    Calcium 8.7 (*)    All other components within normal limits  URINE DRUG SCREEN, QUALITATIVE ([facility] ONLY) - Abnormal; Notable for the following components:   Opiate, Ur Screen POSITIVE (*)    Cannabinoid 50 Ng, Ur  POSITIVE (*)    All other components within normal limits  CBC  SALICYLATE LEVEL   ____________________________________________  EKG  EKG read and interpreted by me shows normal sinus rhythm at 82 slight right axis no acute changes ____________________________________________  RADIOLOGY  ED MD interpretation  Official radiology report(s): No results found.  ____________________________________________   PROCEDURES  Procedure(s) performed (including Critical Care):  Procedures   ____________________________________________   INITIAL IMPRESSION / ASSESSMENT AND PLAN / ED COURSE We will let the patient go.  He will try not to do this again.  Again he says he did not believe there was any opioids around him.  This sounds like an accidental thing.  He realizes that he could have died.             ____________________________________________   FINAL CLINICAL IMPRESSION(S) / ED DIAGNOSES  Final diagnoses:  Accidental overdose, initial encounter     ED Discharge Orders    None       Note:  This document was prepared using Dragon voice recognition software and may include unintentional dictation errors.    Arnaldo NatalMalinda,  F, MD 11/16/18 [id number]    Arnaldo NatalMalinda,  F, MD 11/25/18 [phone]

## 2018-11-16 NOTE — Discharge Instructions (Addendum)
Please try to avoid doing this again.  It might be better to stop using illicit substances completely.  It is kind of expensive almost like smoking cigarettes.  Better not to do it.  Remember you could have died from this experience.

## 2018-11-17 ENCOUNTER — Other Ambulatory Visit: Payer: Self-pay

## 2018-11-17 ENCOUNTER — Emergency Department
Admission: EM | Admit: 2018-11-17 | Discharge: 2018-11-17 | Disposition: A | Discharge status: Home / Self Care | Payer: No Typology Code available for payment source | Attending: Emergency Medicine | Admitting: Emergency Medicine

## 2018-11-17 ENCOUNTER — Emergency Department: Payer: No Typology Code available for payment source

## 2018-11-17 DIAGNOSIS — Y939 Activity, unspecified: Secondary | ICD-10-CM | POA: Insufficient documentation

## 2018-11-17 DIAGNOSIS — Z0471 Encounter for examination and observation following alleged adult physical abuse: Secondary | ICD-10-CM | POA: Diagnosis not present

## 2018-11-17 DIAGNOSIS — Y999 Unspecified external cause status: Secondary | ICD-10-CM | POA: Insufficient documentation

## 2018-11-17 DIAGNOSIS — F17210 Nicotine dependence, cigarettes, uncomplicated: Secondary | ICD-10-CM | POA: Diagnosis not present

## 2018-11-17 DIAGNOSIS — S40021A Contusion of right upper arm, initial encounter: Secondary | ICD-10-CM | POA: Diagnosis not present

## 2018-11-17 DIAGNOSIS — Y929 Unspecified place or not applicable: Secondary | ICD-10-CM | POA: Diagnosis not present

## 2018-11-17 DIAGNOSIS — S8011XA Contusion of right lower leg, initial encounter: Secondary | ICD-10-CM | POA: Diagnosis not present

## 2018-11-17 DIAGNOSIS — S4991XA Unspecified injury of right shoulder and upper arm, initial encounter: Secondary | ICD-10-CM | POA: Diagnosis present

## 2018-11-17 MED ORDER — IBUPROFEN 600 MG PO TABS: 600.0000 mg | ORAL_TABLET | Freq: Three times a day (TID) | ORAL | 0 refills | Status: DC | PRN

## 2018-11-17 NOTE — ED Notes (Signed)
Urine specimen sent to lab with save label 
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
## 2018-11-17 NOTE — ED Notes (Signed)

## 2018-11-17 NOTE — Discharge Instructions (Addendum)
Ice and elevate your arm and your leg as needed for pain and if needed for swelling.  Begin taking ibuprofen every 8 hours as needed for pain and inflammation.  Do not take this medication on empty stomach.
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
## 2019-03-04 ENCOUNTER — Emergency Department
Admission: EM | Admit: 2019-03-04 | Discharge: 2019-03-04 | Disposition: A | Discharge status: Home / Self Care | Payer: Self-pay | Attending: Emergency Medicine | Admitting: Emergency Medicine

## 2019-03-04 ENCOUNTER — Other Ambulatory Visit: Payer: Self-pay

## 2019-03-04 ENCOUNTER — Emergency Department: Payer: Self-pay

## 2019-03-04 DIAGNOSIS — Z5321 Procedure and treatment not carried out due to patient leaving prior to being seen by health care provider: Secondary | ICD-10-CM | POA: Insufficient documentation

## 2019-03-04 DIAGNOSIS — M79672 Pain in left foot: Secondary | ICD-10-CM | POA: Insufficient documentation

## 2019-03-04 NOTE — ED Notes (Signed)
Called patient for protocols, unable to locate patient.
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
## 2019-03-04 NOTE — ED Notes (Signed)
Patient had previous been called for xray without answer.  Patient called again now for room.

## 2019-04-07 ENCOUNTER — Encounter: Payer: Self-pay | Admitting: Emergency Medicine

## 2019-04-07 ENCOUNTER — Emergency Department
Admission: EM | Admit: 2019-04-07 | Discharge: 2019-04-08 | Disposition: A | Discharge status: Home / Self Care | Payer: Self-pay | Attending: Emergency Medicine | Admitting: Emergency Medicine

## 2019-04-07 ENCOUNTER — Other Ambulatory Visit: Payer: Self-pay

## 2019-04-07 DIAGNOSIS — F1994 Other psychoactive substance use, unspecified with psychoactive substance-induced mood disorder: Secondary | ICD-10-CM | POA: Diagnosis present

## 2019-04-07 DIAGNOSIS — F1910 Other psychoactive substance abuse, uncomplicated: Secondary | ICD-10-CM | POA: Insufficient documentation

## 2019-04-07 DIAGNOSIS — T50901A Poisoning by unspecified drugs, medicaments and biological substances, accidental (unintentional), initial encounter: Secondary | ICD-10-CM | POA: Insufficient documentation

## 2019-04-07 DIAGNOSIS — F17210 Nicotine dependence, cigarettes, uncomplicated: Secondary | ICD-10-CM | POA: Insufficient documentation

## 2019-04-07 DIAGNOSIS — F1410 Cocaine abuse, uncomplicated: Secondary | ICD-10-CM | POA: Insufficient documentation

## 2019-04-07 DIAGNOSIS — F1210 Cannabis abuse, uncomplicated: Secondary | ICD-10-CM | POA: Insufficient documentation

## 2019-04-07 DIAGNOSIS — F1110 Opioid abuse, uncomplicated: Secondary | ICD-10-CM | POA: Diagnosis present

## 2019-04-07 LAB — COMPREHENSIVE METABOLIC PANEL
ALT: 48 U/L — ABNORMAL HIGH (ref 0–44)
AST: 36 U/L (ref 15–41)
Albumin: 4.1 g/dL (ref 3.5–5.0)
Alkaline Phosphatase: 54 U/L (ref 38–126)
Anion gap: 8 (ref 5–15)
BUN: 11 mg/dL (ref 6–20)
CO2: 29 mmol/L (ref 22–32)
Calcium: 8.8 mg/dL — ABNORMAL LOW (ref 8.9–10.3)
Chloride: 101 mmol/L (ref 98–111)
Creatinine, Ser: 1.09 mg/dL (ref 0.61–1.24)
GFR calc Af Amer: >60 mL/min (ref >60)
GFR calc non Af Amer: >60 mL/min (ref >60)
Glucose, Bld: 115 mg/dL — ABNORMAL HIGH (ref 70–99)
Potassium: 4.4 mmol/L (ref 3.5–5.1)
Sodium: 138 mmol/L (ref 135–145)
Total Bilirubin: 0.6 mg/dL (ref 0.3–1.2)
Total Protein: 7.5 g/dL (ref 6.5–8.1)

## 2019-04-07 LAB — ACETAMINOPHEN LEVEL: Acetaminophen (Tylenol), Serum: <10 ug/mL — ABNORMAL LOW (ref 10–30)

## 2019-04-07 LAB — CBC
HCT: 45.5 % (ref 39.0–52.0)
Hemoglobin: 15.4 g/dL (ref 13.0–17.0)
MCH: 31.4 pg (ref 26.0–34.0)
MCHC: 33.8 g/dL (ref 30.0–36.0)
MCV: 92.9 fL (ref 80.0–100.0)
Platelets: 196 10*3/uL (ref 150–400)
RBC: 4.9 MIL/uL (ref 4.22–5.81)
RDW: 11.3 % — ABNORMAL LOW (ref 11.5–15.5)
WBC: 7.1 10*3/uL (ref 4.0–10.5)
nRBC: 0 % (ref 0.0–0.2)

## 2019-04-07 LAB — SALICYLATE LEVEL: Salicylate Lvl: <7 mg/dL (ref 2.8–30.0)

## 2019-04-07 LAB — ETHANOL: Alcohol, Ethyl (B): <10 mg/dL (ref <10)

## 2019-04-07 NOTE — ED Triage Notes (Signed)
Pt arrived via [employer] due to overdose on heroine mixed with fentanyl as well as opiates, mariajuana and methadone. Per officer, 6 Narcan doses given on scene. Pt is A&O x4 on arrival. Pt is calm and cooperative. Pt reports he was trying to sleep. Pt denies SI and HI. Pt sts, "I was fine. This has happened before. It takes that much to wake me up. I wasn't trying to hurt myself, I was just trying to sleep. I am perfectly fine."
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
## 2019-04-07 NOTE — ED Notes (Signed)
Pt ready to go. Pt asking when psychiatry is coming. Advised pt they would be here soon as they are in house.

## 2019-04-07 NOTE — ED Provider Notes (Signed)
[facility] Emergency Department Provider Note   ____________________________________________   None    (approximate)  I have reviewed the triage vital signs and the nursing notes.   HISTORY  Chief Complaint Drug Overdose    HPI Clayton Oconnell is a 28 y.o. male brought to the ED under IVC for drug overdose.  Patient admits to using opioids tonight and had an accidental overdose.  Denies active SI/HI/AH/VH.  Narcan was given at the scene.  Brother has called to corroborate patient's story that there was no Narcan actually in the house.  Narcan was given prior to patient's arrival approximately 3 hours ago.  Patient voices no medical complaints.  Specifically, denies fever, cough, chest pain, shortness of breath, abdominal pain, nausea or vomiting.       Past medical history Substance abuse  Patient Active Problem List   Diagnosis Date Noted  . Heroin abuse (HCC) 09/13/2018  . Substance induced mood disorder (HCC) 09/13/2018    Past Surgical History:  Procedure Laterality Date  . FRACTURE SURGERY      Prior to Admission medications   Medication Sig Start Date End Date Taking? Authorizing Provider  ibuprofen (ADVIL) 600 MG tablet Take 1 tablet (600 mg total) by mouth every 8 (eight) hours as needed. Patient not taking: Reported on 04/07/2019 11/17/18   Tommi RumpsSummers, Rhonda L, PA-C    Allergies Tylenol [acetaminophen]  History reviewed. No pertinent family history.  Social History Social History   Tobacco Use  . Smoking status: Current Every Day Smoker    Packs/day: 1.00    Years: 10.00    Pack years: 10.00    Types: Cigarettes    Last attempt to quit: 01/24/2017    Years since quitting: 2.2  . Smokeless tobacco: Current User  Substance Use Topics  . Alcohol use: Yes    Alcohol/week: 1.0 standard drinks    Types: 1 Cans of beer per week    Frequency: Never  . Drug use: Yes    Types: Cocaine, Marijuana    Comment: heroin     Review of Systems  Constitutional: No fever/chills Eyes: No visual changes. ENT: No sore throat. Cardiovascular: Denies chest pain. Respiratory: Denies shortness of breath. Gastrointestinal: No abdominal pain.  No nausea, no vomiting.  No diarrhea.  No constipation. Genitourinary: Negative for dysuria. Musculoskeletal: Negative for back pain. Skin: Negative for rash. Neurological: Negative for headaches, focal weakness or numbness. Psychiatric:  Positive for accidental overdose.  ____________________________________________   PHYSICAL EXAM:  VITAL SIGNS: ED Triage Vitals [04/07/19 2122]  Enc Vitals Group     BP (!) 143/98     Pulse Rate (!) 126     Resp 18     Temp 98.8 F (37.1 C)     Temp Source Oral     SpO2 99 %     Weight      Height      Head Circumference      Peak Flow      Pain Score      Pain Loc      Pain Edu?      Excl. in GC?     Constitutional: Alert and oriented. Well appearing and in no acute distress. Eyes: Conjunctivae are normal. PERRL. EOMI. Head: Atraumatic. Nose: No congestion/rhinnorhea. Mouth/Throat: Mucous membranes are moist.  Oropharynx non-erythematous. Neck: No stridor.   Cardiovascular: Normal rate, regular rhythm. Grossly normal heart sounds.  Good peripheral circulation. Respiratory: Normal respiratory effort.  No retractions.  Lungs CTAB. Gastrointestinal: Soft and nontender. No distention. No abdominal bruits. No CVA tenderness. Musculoskeletal: No lower extremity tenderness nor edema.  No joint effusions. Neurologic:  Normal speech and language. No gross focal neurologic deficits are appreciated. No gait instability. Skin:  Skin is warm, dry and intact. No rash noted. Psychiatric: Mood and affect are normal. Speech and behavior are normal.  ____________________________________________   LABS (all labs ordered are listed, but only abnormal results are displayed)  Labs Reviewed  COMPREHENSIVE METABOLIC PANEL - Abnormal;  Notable for the following components:      Result Value   Glucose, Bld 115 (*)    Calcium 8.8 (*)    ALT 48 (*)    All other components within normal limits  ACETAMINOPHEN LEVEL - Abnormal; Notable for the following components:   Acetaminophen (Tylenol), Serum <10 (*)    All other components within normal limits  CBC - Abnormal; Notable for the following components:   RDW 11.3 (*)    All other components within normal limits  URINE DRUG SCREEN, QUALITATIVE ([facility] ONLY) - Abnormal; Notable for the following components:   Amphetamines, Ur Screen POSITIVE (*)    Cannabinoid 50 Ng, Ur [place] POSITIVE (*)    All other components within normal limits  ETHANOL  SALICYLATE LEVEL  CBG MONITORING, ED   ____________________________________________  EKG  ED ECG REPORT I, Karolee Meloni J, the attending physician, personally viewed and interpreted this ECG.   Date: 04/07/2019  EKG Time: 2129  Rate: 124  Rhythm: sinus tachycardia  Axis: None  Intervals:none  ST&T Change: Nonspecific  ____________________________________________  RADIOLOGY  ED MD interpretation: None  Official radiology report(s): No results found.  ____________________________________________   PROCEDURES  Procedure(s) performed (including Critical Care):  Procedures   ____________________________________________   INITIAL IMPRESSION / ASSESSMENT AND PLAN / ED COURSE  As part of my medical decision making, I reviewed the following data within the electronic MEDICAL RECORD NUMBER Nursing notes reviewed and incorporated, Labs reviewed, Old chart reviewed, A consult was requested and obtained from this/these consultant(s) Psychiatry and Notes from prior ED visits     Clayton Oconnell was evaluated in Emergency Department on 04/08/2019 for the symptoms described in the history of present illness. He was evaluated in the context of the global COVID-19 pandemic, which necessitated consideration that the patient might  be at risk for infection with the SARS-CoV-2 virus that causes COVID-19. Institutional protocols and algorithms that pertain to the evaluation of patients at risk for COVID-19 are in a state of rapid change based on information released by regulatory bodies including the CDC and federal and state organizations. These policies and algorithms were followed during the patient's care in the ED.    28 year old male brought in under IVC for accidental opioid overdose.  He is currently awake, eating a [place] sandwich tray, tachycardia improved.  Has been 3 hours since Narcan.  Will ask psychiatry to assess agree patient would likely be able to be discharged home.  Clinical Course as of Apr 07 122  Tue Apr 08, 2019  0121 Patient was evaluated by psychiatric NP Annice Pih who recommends reversal of IVC and discharge home.  I do agree with this assessment and plan.  Will rescind patient's IVC and discharge home.  Return precautions given.  Patient verbalizes understanding and agrees with plan of care.   [JS]    Clinical Course User Index [JS] Irean Hong, MD     ____________________________________________   FINAL CLINICAL IMPRESSION(S) /  ED DIAGNOSES  Final diagnoses:  Accidental drug overdose, initial encounter  Substance abuse [facility])     ED Discharge Orders    None       Note:  This document was prepared using Dragon voice recognition software and may include unintentional dictation errors.   Paulette Blanch, MD 04/08/19 [phone]

## 2019-04-08 ENCOUNTER — Encounter: Payer: Self-pay | Admitting: Emergency Medicine

## 2019-04-08 ENCOUNTER — Emergency Department
Admission: EM | Admit: 2019-04-08 | Discharge: 2019-04-09 | Disposition: A | Discharge status: Home / Self Care | Payer: Self-pay | Attending: Emergency Medicine | Admitting: Emergency Medicine

## 2019-04-08 ENCOUNTER — Other Ambulatory Visit: Payer: Self-pay

## 2019-04-08 DIAGNOSIS — F1210 Cannabis abuse, uncomplicated: Secondary | ICD-10-CM | POA: Insufficient documentation

## 2019-04-08 DIAGNOSIS — F1110 Opioid abuse, uncomplicated: Secondary | ICD-10-CM | POA: Insufficient documentation

## 2019-04-08 DIAGNOSIS — F17210 Nicotine dependence, cigarettes, uncomplicated: Secondary | ICD-10-CM | POA: Insufficient documentation

## 2019-04-08 LAB — URINE DRUG SCREEN, QUALITATIVE (ARMC ONLY)
Amphetamines, Ur Screen: POSITIVE — AB
Barbiturates, Ur Screen: NOT DETECTED
Benzodiazepine, Ur Scrn: NOT DETECTED
Cannabinoid 50 Ng, Ur ~~LOC~~: POSITIVE — AB
Cocaine Metabolite,Ur ~~LOC~~: NOT DETECTED
MDMA (Ecstasy)Ur Screen: NOT DETECTED
Methadone Scn, Ur: NOT DETECTED
Opiate, Ur Screen: NOT DETECTED
Phencyclidine (PCP) Ur S: NOT DETECTED
Tricyclic, Ur Screen: NOT DETECTED

## 2019-04-08 NOTE — Consult Note (Signed)
[place] Psychiatry Consult   Reason for Consult: Drug overdose Referring Physician: Dr. Beather Arbour Patient Identification: Clayton Oconnell MRN:  [id number] Principal Diagnosis: Substance induced mood disorder ([place]) Diagnosis:  Principal Problem:   Substance induced mood disorder ([place]) Active Problems:   Heroin abuse ([place])   Total Time spent with patient: 45 minutes  Subjective: "They said it was a drug overdose.  I say it was unintentional because I have never done anything like this before." Clayton Oconnell is a 28 y.o. male patient presented to [facility] ED for law enforcement under involuntary commitment status (IVC). Overdose on heroin mixed with fentanyl as outs opioids, marijuana, and methadone. The patient discussed he and his girlfriend have recently broken up, and it has been difficult for him.  The patient discussed he was trying to sleep, and never did he attempt to overdose intentionally.  He voiced, I would never try to hurt myself or hurt anyone else.  The patient discussed he has come along way with his addiction to substances.  He declared I do not know why I did think I will try to kill myself."  The patient was seen face-to-face by this provider; chart reviewed and consulted with Dr. Beather Arbour on 04/08/2019 due to the patient's care. It was discussed with the EDP that the patient does not meet criteria to be admitted to the psychiatric inpatient unit.  The patient is alert and oriented x 4, calm, cooperative, and mood-congruent with affect on evaluation.  The patient does not appear to be responding to internal or external stimuli. Neither is the patient presenting with any delusional thinking. The patient denies auditory or visual hallucinations. The patient denies any suicidal, homicidal, or self-harm ideations. The patient is not presenting with any psychotic or paranoid behaviors. During an encounter with the patient, he was able to answer questions  appropriately. Collateral was not obtained because the patient was adamantly against this provider contacting anyone in his family.  Plan: The patient is not a safety risk to self or others and does not require psychiatric inpatient admission for stabilization and treatment.  HPI: Per Dr. Beather Arbour; Clayton Oconnell is a 28 y.o. male brought to the ED under IVC for drug overdose.  Patient admits to using opioids tonight and had an accidental overdose.  Denies active SI/HI/AH/VH.  Narcan was given at the scene.  Brother has called to corroborate patient's story that there was no Narcan actually in the house.  Narcan was given prior to patient's arrival approximately 3 hours ago.  Patient voices no medical complaints.  Specifically, denies fever, cough, chest pain, shortness of breath, abdominal pain, nausea or vomiting.    Past Psychiatric History:  Substance abuse  Risk to Self:  No Risk to Others:  No Prior Inpatient Therapy:  No Prior Outpatient Therapy:   No  Past Medical History: History reviewed. No pertinent past medical history.  Past Surgical History:  Procedure Laterality Date  . FRACTURE SURGERY     Family History: History reviewed. No pertinent family history. Family Psychiatric  History: Maternal grand mother-depression Social History:  Social History   Substance and Sexual Activity  Alcohol Use Yes  . Alcohol/week: 1.0 standard drinks  . Types: 1 Cans of beer per week  . Frequency: Never     Social History   Substance and Sexual Activity  Drug Use Yes  . Types: Cocaine, Marijuana   Comment: heroin    Social History   Socioeconomic History  .  Marital status: Single    Spouse name: Not on file  . Number of children: Not on file  . Years of education: Not on file  . Highest education level: Not on file  Occupational History  . Not on file  Social Needs  . Financial resource strain: Not on file  . Food insecurity    Worry: Not on file    Inability: Not on  file  . Transportation needs    Medical: Not on file    Non-medical: Not on file  Tobacco Use  . Smoking status: Current Every Day Smoker    Packs/day: 1.00    Years: 10.00    Pack years: 10.00    Types: Cigarettes    Last attempt to quit: 01/24/2017    Years since quitting: 2.2  . Smokeless tobacco: Current User  Substance and Sexual Activity  . Alcohol use: Yes    Alcohol/week: 1.0 standard drinks    Types: 1 Cans of beer per week    Frequency: Never  . Drug use: Yes    Types: Cocaine, Marijuana    Comment: heroin  . Sexual activity: Not on file  Lifestyle  . Physical activity    Days per week: Not on file    Minutes per session: Not on file  . Stress: Not on file  Relationships  . Social Musician on phone: Not on file    Gets together: Not on file    Attends religious service: Not on file    Active member of club or organization: Not on file    Attends meetings of clubs or organizations: Not on file    Relationship status: Not on file  Other Topics Concern  . Not on file  Social History Narrative  . Not on file   Additional Social History:    Allergies:   Allergies  Allergen Reactions  . Tylenol [Acetaminophen] Other (See Comments)    Abdominal pain    Labs:  Results for orders placed or performed during the hospital encounter of 04/07/19 (from the past 48 hour(s))  Comprehensive metabolic panel     Status: Abnormal   Collection Time: 04/07/19  9:23 PM  Result Value Ref Range   Sodium 138 135 - 145 mmol/L   Potassium 4.4 3.5 - 5.1 mmol/L   Chloride 101 98 - 111 mmol/L   CO2 29 22 - 32 mmol/L   Glucose, Bld 115 (H) 70 - 99 mg/dL   BUN 11 6 - 20 mg/dL   Creatinine, Ser 6.96 0.61 - 1.24 mg/dL   Calcium 8.8 (L) 8.9 - 10.3 mg/dL   Total Protein 7.5 6.5 - 8.1 g/dL   Albumin 4.1 3.5 - 5.0 g/dL   AST 36 15 - 41 U/L   ALT 48 (H) 0 - 44 U/L   Alkaline Phosphatase 54 38 - 126 U/L   Total Bilirubin 0.6 0.3 - 1.2 mg/dL   GFR calc non Af Amer >60  >60 mL/min   GFR calc Af Amer >60 >60 mL/min   Anion gap 8 5 - 15    Comment: Performed at [facility], [address], [place], [place] [address]  Ethanol     Status: None   Collection Time: 04/07/19  9:23 PM  Result Value Ref Range   Alcohol, Ethyl (B) <10 <10 mg/dL    Comment: (NOTE) Lowest detectable limit for serum alcohol is 10 mg/dL. For medical purposes only. Performed at [facility], [phone]  [address] [place], [place] [address]   Salicylate level     Status: None   Collection Time: 04/07/19  9:23 PM  Result Value Ref Range   Salicylate Lvl <7.0 2.8 - 30.0 mg/dL    Comment: Performed at [facility], [address] [place], [place] [address]  Acetaminophen level     Status: Abnormal   Collection Time: 04/07/19  9:23 PM  Result Value Ref Range   Acetaminophen (Tylenol), Serum <10 (L) 10 - 30 ug/mL    Comment: (NOTE) Therapeutic concentrations vary significantly. A range of 10-30 ug/mL  may be an effective concentration for many patients. However, some  are best treated at concentrations outside of this range. Acetaminophen concentrations >150 ug/mL at 4 hours after ingestion  and >50 ug/mL at 12 hours after ingestion are often associated with  toxic reactions. Performed at [facility], [address] [place], [place] [address]   cbc     Status: Abnormal   Collection Time: 04/07/19  9:23 PM  Result Value Ref Range   WBC 7.1 4.0 - 10.5 K/uL   RBC 4.90 4.22 - 5.81 MIL/uL   Hemoglobin 15.4 13.0 - 17.0 g/dL   HCT 91.445.5 78.239.0 - 95.652.0 %   MCV 92.9 80.0 - 100.0 fL   MCH 31.4 26.0 - 34.0 pg   MCHC 33.8 30.0 - 36.0 g/dL   RDW 21.311.3 (L) 08.611.5 - 57.815.5 %   Platelets 196 150 - 400 K/uL   nRBC 0.0 0.0 - 0.2 %    Comment: Performed at [facility], [address] [place], [place] [address]  Urine Drug Screen, Qualitative     Status: Abnormal   Collection Time: 04/07/19  9:23 PM  Result Value Ref Range   Tricyclic, Ur Screen NONE  DETECTED NONE DETECTED   Amphetamines, Ur Screen POSITIVE (A) NONE DETECTED   MDMA (Ecstasy)Ur Screen NONE DETECTED NONE DETECTED   Cocaine Metabolite,Ur [place] NONE DETECTED NONE DETECTED   Opiate, Ur Screen NONE DETECTED NONE DETECTED   Phencyclidine (PCP) Ur S NONE DETECTED NONE DETECTED   Cannabinoid 50 Ng, Ur [place] POSITIVE (A) NONE DETECTED   Barbiturates, Ur Screen NONE DETECTED NONE DETECTED   Benzodiazepine, Ur Scrn NONE DETECTED NONE DETECTED   Methadone Scn, Ur NONE DETECTED NONE DETECTED    Comment: (NOTE) Tricyclics + metabolites, urine    Cutoff 1000 ng/mL Amphetamines + metabolites, urine  Cutoff 1000 ng/mL MDMA (Ecstasy), urine              Cutoff 500 ng/mL Cocaine Metabolite, urine          Cutoff 300 ng/mL Opiate + metabolites, urine        Cutoff 300 ng/mL Phencyclidine (PCP), urine         Cutoff 25 ng/mL Cannabinoid, urine                 Cutoff 50 ng/mL Barbiturates + metabolites, urine  Cutoff 200 ng/mL Benzodiazepine, urine              Cutoff 200 ng/mL Methadone, urine                   Cutoff 300 ng/mL The urine drug screen provides only a preliminary, unconfirmed analytical test result and should not be used for non-medical purposes. Clinical consideration and professional judgment should be applied to any positive drug screen result due to possible interfering substances. A more specific alternate chemical method must be used in  order to obtain a confirmed analytical result. Gas chromatography / mass spectrometry (GC/MS) is the preferred confirmat ory method. Performed at [facility], [address] [place], [place] [address]     No current facility-administered medications for this encounter.    Current Outpatient Medications  Medication Sig Dispense Refill  . ibuprofen (ADVIL) 600 MG tablet Take 1 tablet (600 mg total) by mouth every 8 (eight) hours as needed. (Patient not taking: Reported on 04/07/2019) 20 tablet 0     Musculoskeletal: Strength & Muscle Tone: within normal limits Gait & Station: normal Patient leans: N/A  Psychiatric Specialty Exam: Physical Exam  Nursing note and vitals reviewed. Constitutional: He is oriented to person, place, and time. He appears well-developed.  Neck: Normal range of motion. Neck supple.  Respiratory: Effort normal.  Musculoskeletal: Normal range of motion.  Neurological: He is alert and oriented to person, place, and time.  Psychiatric: His behavior is normal. Thought content normal.    Review of Systems  Psychiatric/Behavioral: Positive for substance abuse. The patient is nervous/anxious.   All other systems reviewed and are negative.   Blood pressure (!) 139/101, pulse (!) 109, temperature 98.8 F (37.1 C), temperature source Oral, resp. rate 11, height 6\' 4"  (1.93 m), weight 90.7 kg, SpO2 100 %.Body mass index is 24.34 kg/m.  General Appearance: Casual  Eye Contact:  Good  Speech:  Clear and Coherent  Volume:  Normal  Mood:  Euphoric  Affect:  Congruent  Thought Process:  Coherent  Orientation:  Full (Time, Place, and Person)  Thought Content:  WDL and Logical  Suicidal Thoughts:  No  Homicidal Thoughts:  No  Memory:  Immediate;   Good Recent;   Good Remote;   Good  Judgement:  Intact  Insight:  Good  Psychomotor Activity:  Normal  Concentration:  Concentration: Good and Attention Span: Good  Recall:  Good  Fund of Knowledge:  Good  Language:  Good  Akathisia:  Negative  Handed:  Right  AIMS (if indicated):     Assets:  Desire for Improvement Physical Health Social Support  ADL's:  Intact  Cognition:  WNL  Sleep:   Well     Treatment Plan Summary: Plan Patient does not meet criteria for psychiatric inpatient admission.  He can benefit from some outpatient resources.  Disposition: No evidence of imminent risk to self or others at present.   Patient does not meet criteria for psychiatric inpatient admission. Supportive therapy  provided about ongoing stressors.  , NP 04/08/2019 2:40 AM

## 2019-04-08 NOTE — Discharge Instructions (Addendum)
Return to the ER for recurrent or worsening symptoms, persistent vomiting, lethargy, difficulty breathing or other concerns.

## 2019-04-08 NOTE — ED Triage Notes (Addendum)
Pt arrived via EMS from [place] after bystanders called with reports of patient acting unusual.  Pt states someone was supposed to come and pick him up but were delayed.  Pt was advised by [employer] to come to hospital to be evaluated.  Pt seen here yesterday for drug problem and overdose, pt has hx of opiate use, states he hasn't used anything tonight.  Pt occasionally nods off in triage, but awakens easily. Pt states he has not had much sleep lately.    Pt is alert and oriented on arrival, able to recount what happened last night.  Pt ambulatory without difficulty.   Denies any SI/HI at this time.
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
## 2019-04-08 NOTE — Discharge Instructions (Addendum)
You have been seen in the Emergency Department (ED)  today for a psychiatric complaint.  You have been evaluated by psychiatry and we believe you are safe to be discharged from the hospital.   ° °Please return to the Emergency Department (ED)  immediately if you have ANY thoughts of hurting yourself or anyone else, so that we may help you. ° °Please avoid alcohol and drug use. ° °Follow up with your doctor and/or therapist as soon as possible regarding today's ED  visit.  ° °You may call crisis hotline for [place] at [phone]. ° °

## 2019-04-08 NOTE — ED Notes (Signed)
Pt refusing to allow CBG; Dr Alfred Levins made aware.

## 2019-04-09 ENCOUNTER — Emergency Department
Admission: EM | Admit: 2019-04-09 | Discharge: 2019-04-09 | Discharge status: Against Medical Advice | Payer: Self-pay | Attending: Emergency Medicine | Admitting: Emergency Medicine

## 2019-04-09 DIAGNOSIS — Z5320 Procedure and treatment not carried out because of patient's decision for unspecified reasons: Secondary | ICD-10-CM | POA: Insufficient documentation

## 2019-04-09 DIAGNOSIS — F17210 Nicotine dependence, cigarettes, uncomplicated: Secondary | ICD-10-CM | POA: Insufficient documentation

## 2019-04-09 DIAGNOSIS — F1210 Cannabis abuse, uncomplicated: Secondary | ICD-10-CM | POA: Insufficient documentation

## 2019-04-09 DIAGNOSIS — T50901A Poisoning by unspecified drugs, medicaments and biological substances, accidental (unintentional), initial encounter: Secondary | ICD-10-CM | POA: Insufficient documentation

## 2019-04-09 DIAGNOSIS — F1410 Cocaine abuse, uncomplicated: Secondary | ICD-10-CM | POA: Insufficient documentation

## 2019-04-09 DIAGNOSIS — F17228 Nicotine dependence, chewing tobacco, with other nicotine-induced disorders: Secondary | ICD-10-CM | POA: Insufficient documentation

## 2019-04-09 NOTE — ED Notes (Signed)
Pt left prior to receiving paperwork.  Was unable to obtain esignature prior to discharge.

## 2019-04-09 NOTE — ED Notes (Signed)
Patient is waiting for ride. First nurse called to look out for gold [employer] that his friend is driving.

## 2019-04-09 NOTE — Discharge Instructions (Addendum)
Please be very careful what you use.  You came very close to dying this time.  It would be much better if we could watch you for 2 hours just to make sure that the Narcan does not wear off before the opioid.  If you leave now it is possible that the Narcan will wear off and the opioid will still be there and you will pass out and stop breathing again.

## 2019-04-09 NOTE — ED Provider Notes (Signed)
[facility] Emergency Department Provider Note   ____________________________________________   First MD Initiated Contact with Patient 04/09/19 1708     (approximate)  I have reviewed the triage vital signs and the nursing notes.   HISTORY  Chief Complaint Drug Overdose    HPI Clayton Oconnell is a 28 y.o. male who comes in with EMS.  They gave him 2 mg of Narcan intranasally and 1 mg believe I am he woke up and he is now fine and insisting on leaving.  He understands that the Narcan could wear off before what ever opioid he used is out of his system and he could pass out to stop breathing again but he insists that he is fine and he knows he will be okay and he wants to leave now.  He is signing out against advice.  He is well aware of what could happen.         No past medical history on file.  Patient Active Problem List   Diagnosis Date Noted  . Heroin abuse (HCC) 09/13/2018  . Substance induced mood disorder (HCC) 09/13/2018    Past Surgical History:  Procedure Laterality Date  . FRACTURE SURGERY      Prior to Admission medications   Medication Sig Start Date End Date Taking? Authorizing Provider  ibuprofen (ADVIL) 600 MG tablet Take 1 tablet (600 mg total) by mouth every 8 (eight) hours as needed. Patient not taking: Reported on 04/07/2019 11/17/18   Tommi Rumps, PA-C    Allergies Tylenol [acetaminophen]  No family history on file.  Social History Social History   Tobacco Use  . Smoking status: Current Every Day Smoker    Packs/day: 1.00    Years: 10.00    Pack years: 10.00    Types: Cigarettes    Last attempt to quit: 01/24/2017    Years since quitting: 2.2  . Smokeless tobacco: Current User  Substance Use Topics  . Alcohol use: Yes    Alcohol/week: 1.0 standard drinks    Types: 1 Cans of beer per week    Frequency: Never  . Drug use: Yes    Types: Cocaine, Marijuana    Comment: heroin    Review of Systems   Constitutional: No fever/chills Eyes: No visual changes. ENT: No sore throat. Cardiovascular: Denies chest pain. Respiratory: Denies shortness of breath. Gastrointestinal: No abdominal pain.  No nausea, no vomiting.  No diarrhea.  No constipation. Genitourinary: Negative for dysuria. Musculoskeletal: Negative for back pain. Skin: Negative for rash. Neurological: Negative for headaches, focal weakness   ____________________________________________   PHYSICAL EXAM:  VITAL SIGNS: ED Triage Vitals  Enc Vitals Group     BP 04/09/19 1704 (!) 151/91     Pulse Rate 04/09/19 1704 (!) 105     Resp 04/09/19 1704 18     Temp --      Temp src --      SpO2 04/09/19 1704 100 %     Weight --      Height --      Head Circumference --      Peak Flow --      Pain Score 04/09/19 1705 0     Pain Loc --      Pain Edu? --      Excl. in [place]? --     Constitutional: Alert and oriented. Well appearing and in no acute distress. Eyes: Conjunctivae are normal. PER. EOMI. not pinpoint Head: Atraumatic. Nose: No  congestion/rhinnorhea. Mouth/Throat: Mucous membranes are moist.  Oropharynx non-erythematous. Neck: No stridor.  Cardiovascular: Normal rate, regular rhythm. Grossly normal heart sounds.  Good peripheral circulation. Respiratory: Normal respiratory effort.  No retractions. Lungs CTAB. Gastrointestinal: Soft and nontender. No distention. No abdominal bruits. No CVA tenderness. Musculoskeletal: No lower extremity tenderness nor edema.. Neurologic:  Normal speech and language. No gross focal neurologic deficits are appreciated. Skin:  Skin is warm, dry and intact. No rash noted.   ____________________________________________   LABS (all labs ordered are listed, but only abnormal results are displayed)  Labs Reviewed - No data to display ____________________________________________  EKG   ____________________________________________  RADIOLOGY    Official radiology report(s):  No results found.  ____________________________________________   PROCEDURES  Procedure(s) performed (including Critical Care):  Procedures   ____________________________________________   INITIAL IMPRESSION / ASSESSMENT AND PLAN / ED COURSE          OAK[place] FANT Oconnell was evaluated in Emergency Department on 04/09/2019 for the symptoms described in the history of present illness. He was evaluated in the context of the global COVID-19 pandemic, which necessitated consideration that the patient might be at risk for infection with the SARS-CoV-2 virus that causes COVID-19. Institutional protocols and algorithms that pertain to the evaluation of patients at risk for COVID-19 are in a state of rapid change based on information released by regulatory bodies including the CDC and federal and state organizations. These policies and algorithms were followed during the patient's care in the ED.      ____________________________________________   FINAL CLINICAL IMPRESSION(S) / ED DIAGNOSES  Final diagnoses:  Accidental drug overdose, initial encounter     ED Discharge Orders    None       Note:  This document was prepared using Dragon voice recognition software and may include unintentional dictation errors.    Nena Polio, MD 04/09/19 1710

## 2019-04-09 NOTE — ED Notes (Signed)
Patient used hospital Ascom phone to call for ride. Patient refused to stay, didn't cooperate with triage questions. Patient is calm, but repeatedly states he is not staying. Patient's ride is coming per patient's report and will be here in 5-10 minutes. [employer] at bedside.

## 2019-04-09 NOTE — ED Notes (Signed)
Patient refused to stay in room. STates he will wait in lobby for ride.

## 2019-04-09 NOTE — ED Triage Notes (Signed)
Per EMS report, patient walked into a residence and collapse. Initial pulse ox was in the 30's. Patient was given two doses of intranasal Narcan, the first had no effect. Patient was bagged with ambu bag and pulse ox increased to the 90's. Patient walked from stretcher to ED stretcher and asked to be discharged. Dr. Cinda Quest aware.

## 2020-02-23 ENCOUNTER — Other Ambulatory Visit: Payer: Self-pay

## 2020-02-23 ENCOUNTER — Emergency Department: Payer: Self-pay

## 2020-02-23 ENCOUNTER — Emergency Department
Admission: EM | Admit: 2020-02-23 | Discharge: 2020-02-23 | Disposition: A | Discharge status: Home / Self Care | Payer: Self-pay | Attending: Emergency Medicine | Admitting: Emergency Medicine

## 2020-02-23 DIAGNOSIS — S60921A Unspecified superficial injury of right hand, initial encounter: Secondary | ICD-10-CM | POA: Diagnosis present

## 2020-02-23 DIAGNOSIS — F17210 Nicotine dependence, cigarettes, uncomplicated: Secondary | ICD-10-CM | POA: Diagnosis not present

## 2020-02-23 DIAGNOSIS — X58XXXA Exposure to other specified factors, initial encounter: Secondary | ICD-10-CM | POA: Insufficient documentation

## 2020-02-23 DIAGNOSIS — S6391XA Sprain of unspecified part of right wrist and hand, initial encounter: Secondary | ICD-10-CM | POA: Insufficient documentation

## 2020-02-23 MED ORDER — NAPROXEN 500 MG PO TABS
500.0000 mg | ORAL_TABLET | Freq: Once | ORAL | Status: AC
  Administered 2020-02-23: 500 mg via ORAL
  Filled 2020-02-23: qty 1
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
## 2020-02-23 NOTE — ED Triage Notes (Signed)
Pt arrives to the ED via [employer] with c/o right hand pain. Pt need medical clearance before going to jail. Denies any other injuries.

## 2020-02-23 NOTE — Discharge Instructions (Signed)
Take ibuprofen or tylenol as directed on the packaging if needed for pain.  Follow up with primary care for symptoms that are not improving over the week.  Return to the ER for symptoms that change or worsen if unable to schedule an appointment.
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
## 2020-03-05 ENCOUNTER — Emergency Department
Admission: EM | Admit: 2020-03-05 | Discharge: 2020-03-06 | Disposition: A | Discharge status: Home / Self Care | Attending: Emergency Medicine | Admitting: Emergency Medicine

## 2020-03-05 ENCOUNTER — Other Ambulatory Visit: Payer: Self-pay

## 2020-03-05 ENCOUNTER — Encounter: Payer: Self-pay | Admitting: Emergency Medicine

## 2020-03-05 DIAGNOSIS — F19929 Other psychoactive substance use, unspecified with intoxication, unspecified: Secondary | ICD-10-CM

## 2020-03-05 DIAGNOSIS — F17210 Nicotine dependence, cigarettes, uncomplicated: Secondary | ICD-10-CM | POA: Insufficient documentation

## 2020-03-05 DIAGNOSIS — F19129 Other psychoactive substance abuse with intoxication, unspecified: Secondary | ICD-10-CM | POA: Insufficient documentation

## 2020-03-05 DIAGNOSIS — F1120 Opioid dependence, uncomplicated: Secondary | ICD-10-CM | POA: Insufficient documentation

## 2020-03-05 LAB — COMPREHENSIVE METABOLIC PANEL
ALT: 30 U/L (ref 0–44)
AST: 26 U/L (ref 15–41)
Albumin: 4.2 g/dL (ref 3.5–5.0)
Alkaline Phosphatase: 58 U/L (ref 38–126)
Anion gap: 10 (ref 5–15)
BUN: 9 mg/dL (ref 6–20)
CO2: 25 mmol/L (ref 22–32)
Calcium: 9.1 mg/dL (ref 8.9–10.3)
Chloride: 108 mmol/L (ref 98–111)
Creatinine, Ser: 0.65 mg/dL (ref 0.61–1.24)
GFR, Estimated: >60 mL/min (ref >60)
Glucose, Bld: 125 mg/dL — ABNORMAL HIGH (ref 70–99)
Potassium: 3.6 mmol/L (ref 3.5–5.1)
Sodium: 143 mmol/L (ref 135–145)
Total Bilirubin: 0.5 mg/dL (ref 0.3–1.2)
Total Protein: 7.5 g/dL (ref 6.5–8.1)

## 2020-03-05 LAB — CBC
HCT: 40.9 % (ref 39.0–52.0)
Hemoglobin: 13.7 g/dL (ref 13.0–17.0)
MCH: 28.8 pg (ref 26.0–34.0)
MCHC: 33.5 g/dL (ref 30.0–36.0)
MCV: 86.1 fL (ref 80.0–100.0)
Platelets: 234 10*3/uL (ref 150–400)
RBC: 4.75 MIL/uL (ref 4.22–5.81)
RDW: 13.1 % (ref 11.5–15.5)
WBC: 11.4 10*3/uL — ABNORMAL HIGH (ref 4.0–10.5)
nRBC: 0 % (ref 0.0–0.2)

## 2020-03-05 LAB — ETHANOL: Alcohol, Ethyl (B): <10 mg/dL (ref <10)

## 2020-03-05 LAB — ACETAMINOPHEN LEVEL: Acetaminophen (Tylenol), Serum: <10 ug/mL — ABNORMAL LOW (ref 10–30)

## 2020-03-05 LAB — SALICYLATE LEVEL: Salicylate Lvl: <7 mg/dL — ABNORMAL LOW (ref 7.0–30.0)

## 2020-03-05 NOTE — ED Notes (Signed)
Provided  Pt with a [place] tray sandwich and apple juice pt denies any other needs

## 2020-03-05 NOTE — ED Triage Notes (Signed)
Patient brought in by [employer] for IVC. Per IVC papers respondent [place] [employer]. The defendant was intoxicated and unable to maintain his balance. [name] has since swallowed an unknown amount of pills. [name] is unable to properly communicate. He is slurring is words and or talking in gibberish. [name] is known to use heroin.

## 2020-03-05 NOTE — ED Provider Notes (Signed)
[facility] Emergency Department Provider Note  ____________________________________________  Time seen: Approximately 11:14 PM  I have reviewed the triage vital signs and the nursing notes.   HISTORY  Chief Complaint Psychiatric Evaluation   HPI Clayton Oconnell is a 29 y.o. male with a history of polysubstance abuse and substance induced mood disorder who presents under IVC for intoxication.  According to IVC papers, respondent was [place] to his resident [place] the [place] [facility] PD after he was found intoxicated and unable to maintain his balance.  Per papers, responded has swallowed an unknown amount of pills and was unable to communicate, slurring his words and talking gibberish.  Patient looks slightly intoxicated and is slurring his speech but alert and oriented x3 at this time.  Patient does report drinking earlier today and taking 1 Xanax this evening.  He reports that his grandmother was unable to wake him up after he took the Xanax and called the police.  Patient denies any intentional or accidental overdose.  He denies any suicidal or homicidal ideation.  He does report a history of overdose on Xanax in the past and thinks that this is why his grandmother was so worried. Patient is cooperative and requesting a pillow and some food.    Patient Active Problem List   Diagnosis Date Noted  . Heroin abuse (HCC) 09/13/2018  . Substance induced mood disorder (HCC) 09/13/2018    Past Surgical History:  Procedure Laterality Date  . FRACTURE SURGERY      Prior to Admission medications   Medication Sig Start Date End Date Taking? Authorizing Provider  ibuprofen (ADVIL) 600 MG tablet Take 1 tablet (600 mg total) by mouth every 8 (eight) hours as needed. Patient not taking: Reported on 04/07/2019 11/17/18   Tommi Rumps, PA-C    Allergies Tylenol [acetaminophen]  No family history on file.  Social History Social  History   Tobacco Use  . Smoking status: Current Every Day Smoker    Packs/day: 1.00    Years: 10.00    Pack years: 10.00    Types: Cigarettes  . Smokeless tobacco: Current User  Substance Use Topics  . Alcohol use: Yes    Alcohol/week: 1.0 standard drink    Types: 1 Cans of beer per week  . Drug use: Yes    Types: Cocaine, Marijuana    Comment: heroin    Review of Systems  Constitutional: Negative for fever. Eyes: Negative for visual changes. ENT: Negative for sore throat. Neck: No neck pain  Cardiovascular: Negative for chest pain. Respiratory: Negative for shortness of breath. Gastrointestinal: Negative for abdominal pain, vomiting or diarrhea. Genitourinary: Negative for dysuria. Musculoskeletal: Negative for back pain. Skin: Negative for rash. Neurological: Negative for headaches, weakness or numbness. Psych: No SI or HI  ____________________________________________   PHYSICAL EXAM:  VITAL SIGNS: ED Triage Vitals  Enc Vitals Group     BP 03/05/20 2246 (!) 142/93     Pulse Rate 03/05/20 2246 80     Resp 03/05/20 2246 18     Temp 03/05/20 2246 98.6 F (37 C)     Temp Source 03/05/20 2246 Oral     SpO2 03/05/20 2246 98 %     Weight 03/05/20 2247 200 lb (90.7 kg)     Height 03/05/20 2247 6\' 5"  (1.956 m)     Head Circumference --      Peak Flow --      Pain Score  03/05/20 2247 0     Pain Loc --      Pain Edu? --      Excl. in GC? --     Constitutional: Alert and oriented, slightly intoxicated with mild slurred speech but no distress. HEENT:      Head: Normocephalic and atraumatic.         Eyes: Conjunctivae are normal. Sclera is non-icteric.       Mouth/Throat: Mucous membranes are moist.       Neck: Supple with no signs of meningismus. Cardiovascular: Regular rate and rhythm.  Respiratory: Normal respiratory effort.  Gastrointestinal: Soft, non tender, and non distended. Musculoskeletal: No edema, cyanosis, or erythema of extremities. Neurologic:  Slight slurred speech, normal language. Face is symmetric. Moving all extremities. No gross focal neurologic deficits are appreciated. Skin: Skin is warm, dry and intact. No rash noted. Psychiatric: Mood and affect are normal. Speech and behavior are normal.  ____________________________________________   LABS (all labs ordered are listed, but only abnormal results are displayed)  Labs Reviewed  COMPREHENSIVE METABOLIC PANEL - Abnormal; Notable for the following components:      Result Value   Glucose, Bld 125 (*)    All other components within normal limits  SALICYLATE LEVEL - Abnormal; Notable for the following components:   Salicylate Lvl <7.0 (*)    All other components within normal limits  ACETAMINOPHEN LEVEL - Abnormal; Notable for the following components:   Acetaminophen (Tylenol), Serum <10 (*)    All other components within normal limits  CBC - Abnormal; Notable for the following components:   WBC 11.4 (*)    All other components within normal limits  ETHANOL  URINE DRUG SCREEN, QUALITATIVE ([facility] ONLY)   ____________________________________________  EKG  none ____________________________________________  RADIOLOGY  none  ____________________________________________   PROCEDURES  Procedure(s) performed: None Procedures Critical Care performed:  None ____________________________________________   INITIAL IMPRESSION / ASSESSMENT AND PLAN / ED COURSE  29 y.o. male with a history of polysubstance abuse and substance induced mood disorder who presents under IVC for intoxication and concerns of possible overdose. Patient denies OD and reports drinking EtOH and taking one xanax earlier today. He denies SI or HI. Patient is still slightly intoxicated and slurring his speech. Will maintain patient under IVC until we can get some collateral information from his grandmother who originally called the police and until patient has sobered up. Labs for medical clearance  pending. Will monitor for any signs of respiratory depression. Old medical records reviewed showing several prior visits to the emergency room for drug overdose.  _________________________ 12:02 AM on 03/06/2020 -----------------------------------------  Labs for medical clearance with no acute findings.  UDS is pending.  Patient is medically cleared.  The patient has been placed in psychiatric observation due to the need to provide a safe environment for the patient while obtaining psychiatric consultation and evaluation, as well as ongoing medical and medication management to treat the patient's condition.  The patient has been placed under full IVC at this time.       Please note:  Patient was evaluated in Emergency Department today for the symptoms described in the history of present illness. Patient was evaluated in the context of the global COVID-19 pandemic, which necessitated consideration that the patient might be at risk for infection with the SARS-CoV-2 virus that causes COVID-19. Institutional protocols and algorithms that pertain to the evaluation of patients at risk for COVID-19 are in a state of rapid change based on information  released by regulatory bodies including the CDC and federal and state organizations. These policies and algorithms were followed during the patient's care in the ED.  Some ED evaluations and interventions may be delayed as a result of limited staffing during the pandemic.    FINAL CLINICAL IMPRESSION(S) / ED DIAGNOSES   Final diagnoses:  Drug intoxication with complication (HCC)      NEW MEDICATIONS STARTED DURING THIS VISIT:  ED Discharge Orders    None       Note:  This document was prepared using Dragon voice recognition software and may include unintentional dictation errors.    Don Perking, [place], MD 03/06/20 0002

## 2020-03-05 NOTE — ED Notes (Signed)
Patient changed into hospital scrubs by this RN and Colin Mulders RN. Patient has a pair of shoes, socks, pants, belt, boxers, t-shirt, camp jacket, gray jacket and black jacket

## 2020-03-05 NOTE — ED Notes (Signed)
Dr. V at bedside.

## 2020-03-06 MED ORDER — NICOTINE 21 MG/24HR TD PT24
21.0000 mg | MEDICATED_PATCH | Freq: Once | TRANSDERMAL | Status: DC
  Administered 2020-03-06: 21 mg via TRANSDERMAL
  Filled 2020-03-06: qty 1

## 2020-03-06 MED ORDER — BUPRENORPHINE HCL-NALOXONE HCL 8-2 MG SL SUBL
2.0000 | SUBLINGUAL_TABLET | Freq: Once | SUBLINGUAL | Status: AC
  Administered 2020-03-06: 2 via SUBLINGUAL
  Filled 2020-03-06: qty 2

## 2020-03-06 MED ORDER — DIPHENHYDRAMINE HCL 25 MG PO CAPS
50.0000 mg | ORAL_CAPSULE | Freq: Once | ORAL | Status: AC
  Administered 2020-03-06: 50 mg via ORAL
  Filled 2020-03-06: qty 2

## 2020-03-06 NOTE — ED Notes (Signed)
Pt ambulatory to restroom with steady gait, pt asks for another [place] tray sandwich. RN provided pt with one and apple juice " I am starving"

## 2020-03-06 NOTE — ED Notes (Signed)
Behavioral Intake at bedside

## 2020-03-06 NOTE — BH Assessment (Signed)
Assessment Note  Clayton Oconnell is an 29 y.o. male presenting to [facility] ED under IVC. Per triage note Patient brought in by [employer] for IVC. Per IVC papers respondent was [place] the [place] [employer]. The defendant was intoxicated and unable to maintain his balance. [name] has since swallowed an unknown amount of pills. [name] is unable to properly communicate. He is slurring is words and or talking in gibberish. [name] is known to use heroin. During assessment patient is alert and oriented x3, calm and cooperative, mood is irritable. Patient reports he does not remember what happened today "my grandma called the cops on me and I'm not sure why." Patient reports the only substances he used today "I took 1 Xanax." Patient denies using any other substance and reports a history of heroin use "I last used heroin 7 days ago." Patient reports using heroin via inhaling. Patient also reports currently having 1 heroin possession charge and has a upcoming court date 03/17/20. Patient denies any mental health history, he denies any current substance abuse treatment. When asked if patient would be interested in substance abuse treatment at [facility] patient denies. Patient denies SI/HI/AH/VH and does not appear to be responding to any internal or external stimuli.  Collateral information was attempted from patient's grandmother [place] [phone] but no answer  Per Psyc NP Elenore Paddy patient does not meet criteria for Inpatient Hospitalization   Diagnosis: Opioid Use Disorder Severe  Past Medical History: History reviewed. No pertinent past medical history.  Past Surgical History:  Procedure Laterality Date   FRACTURE SURGERY      Family History: No family history on file.  Social History:  reports that he has been smoking cigarettes. He has a 10.00 pack-year smoking history. He uses smokeless tobacco. He reports current alcohol use of  about 1.0 standard drink of alcohol per week. He reports current drug use. Drugs: Cocaine and Marijuana.  Additional Social History:  Alcohol / Drug Use Pain Medications: See MAR Prescriptions: See MAR Over the Counter: See MAR History of alcohol / drug use?: Yes Substance #1 Name of Substance 1: Heroin  CIWA: CIWA-Ar BP: 133/67 Pulse Rate: 68 COWS:    Allergies:  Allergies  Allergen Reactions   Tylenol [Acetaminophen] Other (See Comments)    Pt states "at high doses it can give me an adverse reaction" Abdominal pain    Home Medications: (Not in a hospital admission)   OB/GYN Status:  No LMP for male patient.  General Assessment Data Location of Assessment: [facility] ED [facility] Assessment: In system Is this a Tele or Face-to-Face Assessment?: Face-to-Face Is this an Initial Assessment or a Re-assessment for this encounter?: Initial Assessment Patient Accompanied by:: N/A Language Other than English: No Living Arrangements: Other (Comment) What gender do you identify as?: Male Marital status: Single Pregnancy Status: No Living Arrangements: Other relatives Can pt return to current living arrangement?: Yes Admission Status: Involuntary Petitioner: Police Is patient capable of signing voluntary admission?: No Referral Source: Other Insurance type: Generic  Medical Screening Exam [facility] Walk-in ONLY) Medical Exam completed: Yes  Crisis Care Plan Living Arrangements: Other relatives Legal Guardian: Other: (Self) Name of Psychiatrist: None Name of Therapist: None  Education Status Is patient currently in school?: No Is the patient employed, unemployed or receiving disability?: Unemployed  Risk to self with the past 6 months Suicidal Ideation: No Has patient been a risk to self within the past 6 months prior to  admission? : No Suicidal Intent: No Has patient had any suicidal intent within the past 6 months prior to admission? : No Is patient at risk for suicide?:  No Suicidal Plan?: No Has patient had any suicidal plan within the past 6 months prior to admission? : No Access to Means: No What has been your use of drugs/alcohol within the last 12 months?: Heroin Previous Attempts/Gestures: No How many times?: 0 Other Self Harm Risks: None Triggers for Past Attempts: None known Intentional Self Injurious Behavior: None Family Suicide History: No Recent stressful life event(s): Other (Comment) (Legal issues) Persecutory voices/beliefs?: No Depression: No Substance abuse history and/or treatment for substance abuse?: No Suicide prevention information given to non-admitted patients: Not applicable  Risk to Others within the past 6 months Homicidal Ideation: No Does patient have any lifetime risk of violence toward others beyond the six months prior to admission? : No Thoughts of Harm to Others: No Current Homicidal Intent: No Current Homicidal Plan: No Access to Homicidal Means: No Identified Victim: None History of harm to others?: No Assessment of Violence: None Noted Violent Behavior Description: None Does patient have access to weapons?: No Criminal Charges Pending?: No Does patient have a court date: No Is patient on probation?: No  Psychosis Hallucinations: None noted Delusions: None noted  Mental Status Report Appearance/Hygiene: In scrubs Eye Contact: Poor Motor Activity: Agitation, Freedom of movement Speech: Logical/coherent Level of Consciousness: Alert Mood: Irritable Affect: Appropriate to circumstance Anxiety Level: None Thought Processes: Coherent Judgement: Unimpaired Orientation: Person, Place, Time, Appropriate for developmental age Obsessive Compulsive Thoughts/Behaviors: None  Cognitive Functioning Concentration: Normal Memory: Remote Intact, Recent Impaired Is patient IDD: No Insight: Poor Impulse Control: Poor Appetite: Good Have you had any weight changes? : No Change Sleep: Decreased Total Hours of  Sleep: 0 Vegetative Symptoms: None  ADLScreening [facility] Assessment Services) Patient's cognitive ability adequate to safely complete daily activities?: Yes Patient able to express need for assistance with ADLs?: Yes Independently performs ADLs?: Yes (appropriate for developmental age)  Prior Inpatient Therapy Prior Inpatient Therapy: No  Prior Outpatient Therapy Prior Outpatient Therapy: No Does patient have an ACCT team?: No Does patient have Intensive In-House Services?  : No Does patient have [facility] services? : No Does patient have P4CC services?: No  ADL Screening (condition at time of admission) Patient's cognitive ability adequate to safely complete daily activities?: Yes Is the patient deaf or have difficulty hearing?: No Does the patient have difficulty seeing, even when wearing glasses/contacts?: No Does the patient have difficulty concentrating, remembering, or making decisions?: No Patient able to express need for assistance with ADLs?: Yes Does the patient have difficulty dressing or bathing?: No Independently performs ADLs?: Yes (appropriate for developmental age) Does the patient have difficulty walking or climbing stairs?: No Weakness of Legs: None Weakness of Arms/Hands: None  Home Assistive Devices/Equipment Home Assistive Devices/Equipment: None  Therapy Consults (therapy consults require a physician order) PT Evaluation Needed: No OT Evalulation Needed: No SLP Evaluation Needed: No Abuse/Neglect Assessment (Assessment to be complete while patient is alone) Abuse/Neglect Assessment Can Be Completed: Yes Physical Abuse: Denies Verbal Abuse: Denies Sexual Abuse: Denies Exploitation of patient/patient's resources: Denies Self-Neglect: Denies Values / Beliefs Cultural Requests During Hospitalization: None Spiritual Requests During Hospitalization: None Consults Spiritual Care Consult Needed: No Transition of Care Team Consult Needed: No Advance  Directives (For Healthcare) Does Patient Have a Medical Advance Directive?: No          Disposition: Per Psyc NP Elenore Paddy patient  does not meet criteria for Inpatient Hospitalization Disposition Initial Assessment Completed for this Encounter: Yes  On Site Evaluation by:   Reviewed with Physician:    Benay Pike MS LCASA 03/06/2020 1:08 AM

## 2020-03-06 NOTE — ED Notes (Signed)
Pt watching TV

## 2020-03-06 NOTE — ED Notes (Signed)
Patient is requesting to leave now. States he is feeling much better. Doesn't recall being picked up at the [facility] yesterday but states that "My grandmother probably wouldn't let me in the house." Asked who will pick him up and he states he will catch the bus.

## 2020-03-06 NOTE — ED Notes (Signed)
Soc called in at 11:09

## 2020-03-06 NOTE — ED Notes (Signed)
Pt ambulatory to restroom

## 2020-03-06 NOTE — ED Notes (Signed)
Patient tolerated po Suboxone well without incident. Patient admits to daily heroin use. Patient states he is going through "withdrawal pretty bad right now."

## 2020-03-06 NOTE — ED Notes (Signed)
Breakfast and apple juice placed on floor per pt request.

## 2020-03-06 NOTE — ED Notes (Signed)
Dr. Don Perking and Rn at bedside pt yelling in room reports he is not able to sleep and wants something to put him to sleep. Dr. Don Perking asked pt to try to rest pt responds he does heroine every nigh and is not able to sleep, Dr. Warren Lacy explained he has taken medication prior to arrival to ER that is not precribed to him, Dr. Don Perking offered to prescribe some Benadryl pt replied " if you give me like 20 of them" Dr. Don Perking encouraged pt to try to rest. Pt responded to MD that he will be yelling all night. Rn closed door and pt's yelled for 5 minutes and calmed down. Will continue to monitor

## 2020-03-06 NOTE — ED Notes (Signed)
MD aware of patient request to leave. States patient will need to be cleared by psychiatry before he can be discharged.

## 2020-06-02 ENCOUNTER — Encounter: Payer: Self-pay | Admitting: Emergency Medicine

## 2020-06-02 ENCOUNTER — Emergency Department
Admission: EM | Admit: 2020-06-02 | Discharge: 2020-06-02 | Disposition: A | Discharge status: Home / Self Care | Payer: Self-pay | Attending: Emergency Medicine | Admitting: Emergency Medicine

## 2020-06-02 ENCOUNTER — Other Ambulatory Visit: Payer: Self-pay

## 2020-06-02 DIAGNOSIS — Z008 Encounter for other general examination: Secondary | ICD-10-CM

## 2020-06-02 DIAGNOSIS — F17210 Nicotine dependence, cigarettes, uncomplicated: Secondary | ICD-10-CM | POA: Insufficient documentation

## 2020-06-02 DIAGNOSIS — X58XXXA Exposure to other specified factors, initial encounter: Secondary | ICD-10-CM | POA: Insufficient documentation

## 2020-06-02 DIAGNOSIS — S01312A Laceration without foreign body of left ear, initial encounter: Secondary | ICD-10-CM | POA: Insufficient documentation

## 2020-06-02 DIAGNOSIS — S0101XA Laceration without foreign body of scalp, initial encounter: Secondary | ICD-10-CM

## 2020-06-02 NOTE — ED Provider Notes (Signed)
[facility] Emergency Department Provider Note   ____________________________________________   Event Date/Time   First MD Initiated Contact with Patient 06/02/20 1227     (approximate)  I have reviewed the triage vital signs and the nursing notes.   HISTORY  Chief Complaint Medical Clearance (/)    HPI Clayton Oconnell is a 30 y.o. male patient arrived in police custody with lacerations of posterior left ear.  Patient will not give history on how he had obtained the laceration.  Patient denies LOC or other head injuries.  Pain is controlled with direct pressure.         History reviewed. No pertinent past medical history.  Patient Active Problem List   Diagnosis Date Noted  . Heroin abuse (HCC) 09/13/2018  . Substance induced mood disorder (HCC) 09/13/2018    Past Surgical History:  Procedure Laterality Date  . FRACTURE SURGERY    . TONSILLECTOMY      Prior to Admission medications   Not on File    Allergies Tylenol [acetaminophen]  No family history on file.  Social History Social History   Tobacco Use  . Smoking status: Current Every Day Smoker    Packs/day: 1.00    Years: 10.00    Pack years: 10.00    Types: Cigarettes  . Smokeless tobacco: Current User  Substance Use Topics  . Alcohol use: Yes    Alcohol/week: 1.0 standard drink    Types: 1 Cans of beer per week  . Drug use: Yes    Types: Cocaine, Marijuana    Comment: heroin    Review of Systems Constitutional: No fever/chills Eyes: No visual changes. ENT: No sore throat. Cardiovascular: Denies chest pain. Respiratory: Denies shortness of breath. Gastrointestinal: No abdominal pain.  No nausea, no vomiting.  No diarrhea.  No constipation. Genitourinary: Negative for dysuria. Musculoskeletal: Negative for back pain. Skin: Negative for rash.  Laceration posterior ear and lateral inferior scalp. Neurological: Negative for headaches, focal weakness or  numbness. Psychiatric:   substance abuse Allergic/Immunilogical: Tylenol ____________________________________________   PHYSICAL EXAM:  VITAL SIGNS: ED Triage Vitals [06/02/20 1222]  Enc Vitals Group     BP (!) 133/104     Pulse Rate 82     Resp 20     Temp 98.4 F (36.9 C)     Temp Source Oral     SpO2 100 %     Weight 199 lb 15.3 oz (90.7 kg)     Height 6\' 5"  (1.956 m)     Head Circumference      Peak Flow      Pain Score 0     Pain Loc      Pain Edu?      Excl. in GC?     Constitutional: Alert and oriented. Well appearing and in no acute distress. Eyes: Conjunctivae are normal. PERRL. EOMI. Head: Atraumatic. Nose: No congestion/rhinnorhea. Mouth/Throat: Mucous membranes are moist.  Oropharynx non-erythematous. Neck: No stridor.  No cervical spine tenderness to palpation. Hematological/Lymphatic/Immunilogical: No cervical lymphadenopathy. Cardiovascular: Normal rate, regular rhythm. Grossly normal heart sounds.  Good peripheral circulation. Respiratory: Normal respiratory effort.  No retractions. Lungs CTAB. Gastrointestinal: Soft and nontender. No distention. No abdominal bruits. No CVA tenderness. Genitourinary: Deferred Musculoskeletal: No lower extremity tenderness nor edema.  No joint effusions. Neurologic:  Normal speech and language. No gross focal neurologic deficits are appreciated. No gait instability. Skin: 0.3 cm laceration posterior left knee.    ____________________________________________   LABS (all  labs ordered are listed, but only abnormal results are displayed)  Labs Reviewed - No data to display ____________________________________________  EKG   ____________________________________________  RADIOLOGY I, Joni Reining, personally viewed and evaluated these images (plain radiographs) as part of my medical decision making, as well as reviewing the written report by the radiologist.  ED MD interpretation:    Official radiology  report(s): No results found.  ____________________________________________   PROCEDURES  Procedure(s) performed (including Critical Care):  Marland KitchenMarland KitchenLaceration Repair  Date/Time: 06/02/2020 1:20 PM Performed by: Joni Reining, PA-C Authorized by: Joni Reining, PA-C   Consent:    Consent obtained:  Verbal   Consent given by:  Patient   Risks discussed:  Infection, pain, poor cosmetic result, need for additional repair and poor wound healing Universal protocol:    Procedure explained and questions answered to patient or proxy's satisfaction: yes     Relevant documents present and verified: no     Test results available: no     Imaging studies available: no     Required blood products, implants, devices, and special equipment available: no     Site/side marked: no     Immediately prior to procedure, a time out was called: yes     Patient identity confirmed:  Verbally with patient Anesthesia:    Anesthesia method:  None Laceration details:    Location:  Ear   Ear location:  L ear   Length (cm):  0.3   Depth (mm):  2 Pre-procedure details:    Preparation:  Patient was prepped and draped in usual sterile fashion Exploration:    Contaminated: no   Treatment:    Area cleansed with:  Povidone-iodine and saline   Amount of cleaning:  Standard   Debridement:  None   Undermining:  None   Scar revision: no   Skin repair:    Repair method:  Tissue adhesive Approximation:    Approximation:  Close Post-procedure details:    Dressing:  Sterile dressing   Procedure completion:  Tolerated well, no immediate complications     ____________________________________________   INITIAL IMPRESSION / ASSESSMENT AND PLAN / ED COURSE  As part of my medical decision making, I reviewed the following data within the electronic MEDICAL RECORD NUMBER         Patient arrived in police custody with a laceration to the posterior left knee.  See procedure note for wound closure.  Patient was  examined and medically cleared for incarceration.     ____________________________________________   FINAL CLINICAL IMPRESSION(S) / ED DIAGNOSES  Final diagnoses:  Medical clearance for incarceration  Laceration of scalp, initial encounter     ED Discharge Orders    None      *Please note:  Clayton Oconnell was evaluated in Emergency Department on 06/02/2020 for the symptoms described in the history of present illness. He was evaluated in the context of the global COVID-19 pandemic, which necessitated consideration that the patient might be at risk for infection with the SARS-CoV-2 virus that causes COVID-19. Institutional protocols and algorithms that pertain to the evaluation of patients at risk for COVID-19 are in a state of rapid change based on information released by regulatory bodies including the CDC and federal and state organizations. These policies and algorithms were followed during the patient's care in the ED.  Some ED evaluations and interventions may be delayed as a result of limited staffing during and the pandemic.*   Note:  This document was prepared  using Conservation officer, historic buildings and may include unintentional dictation errors.    Joni Reining, PA-C 06/02/20 1322    Chesley Noon, MD 06/03/20 [phone]

## 2020-06-02 NOTE — ED Triage Notes (Signed)
Pt to ED via [employer] with c/o small laceration behind L ear, dried blood noted to laceration, however no further bleeding noted at this time. Pt states happened earlier today.   Pt here for medical clearance for jail however patient also requesting to refuse care.

## 2020-06-02 NOTE — ED Notes (Signed)
See triage note  Presents with [employer]  Needs clearance for jail  Had small laceration behind left ear

## 2020-06-02 NOTE — Discharge Instructions (Addendum)
Patient is medically cleared for incarceration.  Follow discharge care instructions.

## 2020-07-20 ENCOUNTER — Emergency Department
Admission: EM | Admit: 2020-07-20 | Discharge: 2020-07-21 | Disposition: A | Discharge status: Home / Self Care | Payer: Self-pay | Attending: Emergency Medicine | Admitting: Emergency Medicine

## 2020-07-20 DIAGNOSIS — T401X1A Poisoning by heroin, accidental (unintentional), initial encounter: Secondary | ICD-10-CM | POA: Insufficient documentation

## 2020-07-20 DIAGNOSIS — F17210 Nicotine dependence, cigarettes, uncomplicated: Secondary | ICD-10-CM | POA: Insufficient documentation

## 2020-07-20 DIAGNOSIS — X58XXXA Exposure to other specified factors, initial encounter: Secondary | ICD-10-CM | POA: Insufficient documentation

## 2020-07-20 DIAGNOSIS — R0689 Other abnormalities of breathing: Secondary | ICD-10-CM | POA: Insufficient documentation

## 2020-07-20 LAB — CBC WITH DIFFERENTIAL/PLATELET
Abs Immature Granulocytes: 0.01 10*3/uL (ref 0.00–0.07)
Basophils Absolute: 0 10*3/uL (ref 0.0–0.1)
Basophils Relative: 0 %
Eosinophils Absolute: 0.2 10*3/uL (ref 0.0–0.5)
Eosinophils Relative: 2 %
HCT: 38.9 % — ABNORMAL LOW (ref 39.0–52.0)
Hemoglobin: 12.8 g/dL — ABNORMAL LOW (ref 13.0–17.0)
Immature Granulocytes: 0 %
Lymphocytes Relative: 31 %
Lymphs Abs: 2.3 10*3/uL (ref 0.7–4.0)
MCH: 28.9 pg (ref 26.0–34.0)
MCHC: 32.9 g/dL (ref 30.0–36.0)
MCV: 87.8 fL (ref 80.0–100.0)
Monocytes Absolute: 0.6 10*3/uL (ref 0.1–1.0)
Monocytes Relative: 8 %
Neutro Abs: 4.4 10*3/uL (ref 1.7–7.7)
Neutrophils Relative %: 59 %
Platelets: 211 10*3/uL (ref 150–400)
RBC: 4.43 MIL/uL (ref 4.22–5.81)
RDW: 13.2 % (ref 11.5–15.5)
WBC: 7.5 10*3/uL (ref 4.0–10.5)
nRBC: 0 % (ref 0.0–0.2)

## 2020-07-20 LAB — COMPREHENSIVE METABOLIC PANEL
ALT: 31 U/L (ref 0–44)
AST: 30 U/L (ref 15–41)
Albumin: 4.1 g/dL (ref 3.5–5.0)
Alkaline Phosphatase: 55 U/L (ref 38–126)
Anion gap: 7 (ref 5–15)
BUN: 15 mg/dL (ref 6–20)
CO2: 31 mmol/L (ref 22–32)
Calcium: 8.9 mg/dL (ref 8.9–10.3)
Chloride: 99 mmol/L (ref 98–111)
Creatinine, Ser: 0.91 mg/dL (ref 0.61–1.24)
GFR, Estimated: >60 mL/min (ref >60)
Glucose, Bld: 92 mg/dL (ref 70–99)
Potassium: 3.5 mmol/L (ref 3.5–5.1)
Sodium: 137 mmol/L (ref 135–145)
Total Bilirubin: 0.6 mg/dL (ref 0.3–1.2)
Total Protein: 6.7 g/dL (ref 6.5–8.1)

## 2020-07-20 LAB — SALICYLATE LEVEL: Salicylate Lvl: <7 mg/dL — ABNORMAL LOW (ref 7.0–30.0)

## 2020-07-20 LAB — ACETAMINOPHEN LEVEL: Acetaminophen (Tylenol), Serum: <10 ug/mL — ABNORMAL LOW (ref 10–30)

## 2020-07-20 LAB — ETHANOL: Alcohol, Ethyl (B): <10 mg/dL (ref <10)

## 2020-07-20 MED ORDER — ONDANSETRON HCL 4 MG/2ML IJ SOLN
4.0000 mg | Freq: Once | INTRAMUSCULAR | Status: AC
  Administered 2020-07-20: 4 mg via INTRAVENOUS
  Filled 2020-07-20: qty 2

## 2020-07-20 MED ORDER — NALOXONE HCL 4 MG/0.1ML NA LIQD: 0.4000 mg | Freq: Once | NASAL | 0 refills | Status: AC

## 2020-07-20 MED ORDER — NALOXONE HCL 2 MG/2ML IJ SOSY
2.0000 mg | PREFILLED_SYRINGE | Freq: Once | INTRAMUSCULAR | Status: AC
  Administered 2020-07-20: 2 mg via INTRAVENOUS

## 2020-07-20 MED ORDER — LACTATED RINGERS IV BOLUS
1000.0000 mL | Freq: Once | INTRAVENOUS | Status: AC
  Administered 2020-07-20: 1000 mL via INTRAVENOUS

## 2020-07-20 NOTE — Discharge Instructions (Addendum)
We sent a prescription for Narcan nasal spray to the pharmacy for you  Return to the ED with any additional overdoses or if you are ready to seek treatment and assistance with your drug use.  Steps to find a Primary Care Provider (PCP):  Call [phone] or [phone] to access "[place] Find a Doctor Service."  2.  You may also go on the [facility] website at [email or web address]

## 2020-07-20 NOTE — ED Provider Notes (Signed)
[facility] Emergency Department Provider Note ____________________________________________   Event Date/Time   First MD Initiated Contact with Patient 07/20/20 2224     (approximate)  I have reviewed the triage vital signs and the nursing notes.  HISTORY  Chief Complaint Drug Overdose   HPI Clayton Oconnell is a 30 y.o. malewho presents to the ED for evaluation of unresponsiveness   Chart review indicates history of polysubstance abuse  Patient was dropped off via POV to our ED unresponsive, with weak and sonorous respirations, by a friend.  Friend stated that patient had a needle sticking out of his arm, assumed heroin overdose.  He is immediately rushed to a room, I provide jaw thrust for airway support.  I note pinpoint pupils.  Bradypnea noted, never lost pulses.  Nurses quickly placed IV and provided Narcan with improvement of his mental status.  No past medical history on file.  Patient Active Problem List   Diagnosis Date Noted  . Heroin abuse (HCC) 09/13/2018  . Substance induced mood disorder (HCC) 09/13/2018    Past Surgical History:  Procedure Laterality Date  . FRACTURE SURGERY    . TONSILLECTOMY      Prior to Admission medications   Not on File    Allergies Tylenol [acetaminophen]  No family history on file.  Social History Social History   Tobacco Use  . Smoking status: Current Every Day Smoker    Packs/day: 1.00    Years: 10.00    Pack years: 10.00    Types: Cigarettes  . Smokeless tobacco: Current User  Substance Use Topics  . Alcohol use: Yes    Alcohol/week: 1.0 standard drink    Types: 1 Cans of beer per week  . Drug use: Yes    Types: Cocaine, Marijuana    Comment: heroin    Review of Systems  Constitutional: No fever/chills Eyes: No visual changes. ENT: No sore throat. Cardiovascular: Denies chest pain. Respiratory: Denies shortness of breath. Gastrointestinal: No abdominal pain.  No nausea,  no vomiting.  No diarrhea.  No constipation. Genitourinary: Negative for dysuria. Musculoskeletal: Negative for back pain. Skin: Negative for rash. Neurological: Negative for headaches, focal weakness or numbness.  ____________________________________________   PHYSICAL EXAM:  VITAL SIGNS: Vitals:   07/20/20 2245 07/20/20 2300  BP:    Pulse: 80 80  Resp: 16 15  SpO2: 100% 100%    Upon arrival prior to Narcan administration:  Constitutional: Somnolent with pinpoint pupils.  Slow and sonorous respirations. Eyes: Conjunctivae are normal.  Pinpoint pupils bilaterally. Head: Atraumatic. Nose: No congestion/rhinnorhea. Mouth/Throat: Mucous membranes are moist.  Oropharynx non-erythematous. Neck: No stridor. No cervical spine tenderness to palpation. Cardiovascular: Normal rate, regular rhythm. Grossly normal heart sounds.  Good peripheral circulation. Respiratory: Bradypnea noted.  Clear lungs. Gastrointestinal: Soft , nondistended, nontender to palpation. No CVA tenderness. Musculoskeletal: No lower extremity tenderness nor edema.  No joint effusions. No signs of acute trauma. Neurologic: GCS 3, improving to GCS 15 after Narcan. No evidence of focal deficits. Cranial nerves II through XII intact 5/5 strength and sensation in all 4 extremities Skin:  Skin is warm, dry and intact. No rash noted. Psychiatric: Mood and affect are unable to be assessed  ____________________________________________   LABS (all labs ordered are listed, but only abnormal results are displayed)  Labs Reviewed  CBC WITH DIFFERENTIAL/PLATELET - Abnormal; Notable for the following components:      Result Value   Hemoglobin 12.8 (*)    HCT 38.9 (*)  All other components within normal limits  COMPREHENSIVE METABOLIC PANEL  URINE DRUG SCREEN, QUALITATIVE ([facility] ONLY)  ETHANOL  SALICYLATE LEVEL  ACETAMINOPHEN LEVEL   ____________________________________________  12 Lead EKG  Sinus rhythm, rate  of 87 bpm.  Normal axis and intervals.  Widespread upsloping submillimeter J-point elevation consistent with benign early repolarization.  No ischemic features. __________________________   PROCEDURES and INTERVENTIONS  Procedure(s) performed (including Critical Care):  .1-3 Lead EKG Interpretation Performed by: Delton Prairie, MD Authorized by: Delton Prairie, MD     Interpretation: normal     ECG rate:  76   ECG rate assessment: normal     Rhythm: sinus rhythm     Ectopy: none     Conduction: normal      Medications  ondansetron [facility]) injection 4 mg (4 mg Intravenous Given 07/20/20 2235)  lactated ringers bolus 1,000 mL (1,000 mLs Intravenous New Bag/Given 07/20/20 2234)  naloxone [facility]) injection 2 mg (2 mg Intravenous Given 07/20/20 2222)    ____________________________________________   MDM / ED COURSE   30 year old male with history of heroin abuse presents to the ED after accidental opiate overdose requiring Narcan.  Patient presents unresponsive in an opiate overdose toxidrome picture, resolving after Narcan.  No subsequent neurovascular deficits or signs of trauma.  Patient denies suicidal intent and denies coingestions beyond intravenous heroin.  EKG is nonischemic and blood work is pending at time of signout to oncoming provider.  Patient will be signed out and observe for a couple hours to ensure no need for redosing of Narcan.  He is denying assistance for subs abuse at this time.  As long as he does not need additional Narcan and no other changes, anticipate he would be suitable for outpatient management  Clinical Course as of 07/20/20 2325  Tue Jul 20, 2020  2224 Pt Immediately rushed to a room, urgently placed on IV to his left AC and nurses provided 2 mg of IV Narcan.  Subsequently waking up, looking around and repeatedly saying "I am good, I am good."  I leave at this point to let the nurses triage the patient. [DS]  2319 Patient reassessed.  He is laying on his  side and reports feeling fine.  I again asked the patient about suicidal intent and coingestions.  He denies suicidal intent, plan and denies additional recreational drugs this evening.  He reports that he is not been using heroin as much recently, and thinks he may have just taken too much tonight. [DS]    Clinical Course User Index [DS] Delton Prairie, MD    ____________________________________________   FINAL CLINICAL IMPRESSION(S) / ED DIAGNOSES  Final diagnoses:  None     ED Discharge Orders    None       Clayton Oconnell   Note:  This document was prepared using Dragon voice recognition software and may include unintentional dictation errors.   Delton Prairie, MD 07/20/20 [phone]

## 2020-07-20 NOTE — ED Notes (Signed)
Narcan admin and pt immed awakens; pt oriented to time & place

## 2020-07-20 NOTE — ED Notes (Signed)
Pt in front passenger seat of vehicle, unresponsive, weak pulse, snoring resp; accomp by friend who st he "had needle in his arm"; pt pulled out onto stretcher and taken immed to room 14; Dr Adaline Sill, and several nurse at bedside to assist

## 2020-07-20 NOTE — ED Triage Notes (Signed)
Pt reportedly found down with needle in arm (heroin). Brought in by friends, pt unresponsive with sonorous breathing on initial arrival

## 2020-07-20 NOTE — ED Provider Notes (Signed)
11:00 PM  Assumed care.  Patient here after accidental heroin overdose requiring Narcan.  Labs pending.  We will continue to observe.  Patient denies SI.  2:20 AM  Pt continues to be very intoxicated but is arousable to verbal stimuli but still has slurred speech and is somnolent.  No hypoxia.  No apnea.  We will continue to monitor.  Will recheck blood glucose.  Labs unremarkable today.  6:00 AM  Pt is awake, alert, eating and drinking without difficulty.  He has been able to ambulate.  He appears clinically sober.  He states that he is currently trying to get into a rehab facility and has his name on a waiting list.  Have offered to provide him resources.  He states he also has Narcan at home.  Discussed with patient that he is at increased risk for death given he required Narcan for his opiate overdose.  He verbalized understanding.  He declines wanting detox at this time.  No SI or HI.  Will discharge home.  At this time, I do not feel there is any life-threatening condition present. I have reviewed, interpreted and discussed all results (EKG, imaging, lab, urine as appropriate) and exam findings with patient/family. I have reviewed nursing notes and appropriate previous records.  I feel the patient is safe to be discharged home without further emergent workup and can continue workup as an outpatient as needed. Discussed usual and customary return precautions. Patient/family verbalize understanding and are comfortable with this plan.  Outpatient follow-up has been provided as needed. All questions have been answered.    Domino Holten, Layla Maw, DO 07/21/20 [phone]

## 2020-07-21 ENCOUNTER — Other Ambulatory Visit: Payer: Self-pay

## 2020-07-21 LAB — CBG MONITORING, ED: Glucose-Capillary: 95 mg/dL (ref 70–99)

## 2020-07-21 LAB — URINE DRUG SCREEN, QUALITATIVE (ARMC ONLY)
Amphetamines, Ur Screen: POSITIVE — AB
Barbiturates, Ur Screen: NOT DETECTED
Benzodiazepine, Ur Scrn: NOT DETECTED
Cannabinoid 50 Ng, Ur ~~LOC~~: POSITIVE — AB
Cocaine Metabolite,Ur ~~LOC~~: POSITIVE — AB
MDMA (Ecstasy)Ur Screen: NOT DETECTED
Methadone Scn, Ur: NOT DETECTED
Opiate, Ur Screen: POSITIVE — AB
Phencyclidine (PCP) Ur S: NOT DETECTED
Tricyclic, Ur Screen: NOT DETECTED

## 2020-07-21 NOTE — ED Notes (Signed)
Dr Elesa Massed at bedside to reassess patient.

## 2020-07-21 NOTE — ED Notes (Signed)
Aggie Cosier, RN entered room at [phone] to dc patient. Pt not found in room. Gown left on bed and IV catheter had been removed and was intact. Pt left prior to receiving printed d/c instructions. Dr Elesa Massed and Charge RN Raquel notified.

## 2020-07-21 NOTE — Progress Notes (Signed)
RN went to patient room to take vitals and discuss discharge documentation. Pt. Was not in the room. RN visualized pt gown on stretcher and intact IV in trash can.

## 2020-07-21 NOTE — ED Notes (Addendum)
Pt ambulated in room to use urinal independently. Pt given meal tray and soda. Pt A&Ox4 at this time and updated on POC.

## 2021-09-20 ENCOUNTER — Encounter (HOSPITAL_BASED_OUTPATIENT_CLINIC_OR_DEPARTMENT_OTHER): Payer: Self-pay | Admitting: Emergency Medicine

## 2021-09-20 ENCOUNTER — Other Ambulatory Visit: Payer: Self-pay

## 2021-09-20 ENCOUNTER — Emergency Department (HOSPITAL_BASED_OUTPATIENT_CLINIC_OR_DEPARTMENT_OTHER)
Admission: EM | Admit: 2021-09-20 | Discharge: 2021-09-20 | Disposition: A | Discharge status: Home / Self Care | Payer: Self-pay | Attending: Emergency Medicine | Admitting: Emergency Medicine

## 2021-09-20 DIAGNOSIS — X31XXXA Exposure to excessive natural cold, initial encounter: Secondary | ICD-10-CM | POA: Insufficient documentation

## 2021-09-20 DIAGNOSIS — T69021A Immersion foot, right foot, initial encounter: Secondary | ICD-10-CM | POA: Insufficient documentation

## 2021-09-20 DIAGNOSIS — T69022A Immersion foot, left foot, initial encounter: Secondary | ICD-10-CM | POA: Insufficient documentation

## 2021-09-20 DIAGNOSIS — T69029A Immersion foot, unspecified foot, initial encounter: Secondary | ICD-10-CM

## 2021-09-20 DIAGNOSIS — E860 Dehydration: Secondary | ICD-10-CM | POA: Insufficient documentation

## 2021-09-20 DIAGNOSIS — F1510 Other stimulant abuse, uncomplicated: Secondary | ICD-10-CM | POA: Insufficient documentation

## 2021-09-20 LAB — RAPID URINE DRUG SCREEN, HOSP PERFORMED
Amphetamines: POSITIVE — AB
Barbiturates: NOT DETECTED
Benzodiazepines: NOT DETECTED
Cocaine: NOT DETECTED
Opiates: NOT DETECTED
Tetrahydrocannabinol: NOT DETECTED

## 2021-09-20 LAB — COMPREHENSIVE METABOLIC PANEL
ALT: 45 U/L — ABNORMAL HIGH (ref 0–44)
AST: 79 U/L — ABNORMAL HIGH (ref 15–41)
Albumin: 3.9 g/dL (ref 3.5–5.0)
Alkaline Phosphatase: 38 U/L (ref 38–126)
Anion gap: 10 (ref 5–15)
BUN: 32 mg/dL — ABNORMAL HIGH (ref 6–20)
CO2: 27 mmol/L (ref 22–32)
Calcium: 9.2 mg/dL (ref 8.9–10.3)
Chloride: 99 mmol/L (ref 98–111)
Creatinine, Ser: 1.31 mg/dL — ABNORMAL HIGH (ref 0.61–1.24)
GFR, Estimated: >60 mL/min (ref >60)
Glucose, Bld: 97 mg/dL (ref 70–99)
Potassium: 3.8 mmol/L (ref 3.5–5.1)
Sodium: 136 mmol/L (ref 135–145)
Total Bilirubin: 0.7 mg/dL (ref 0.3–1.2)
Total Protein: 7 g/dL (ref 6.5–8.1)

## 2021-09-20 LAB — CBC
HCT: 39.1 % (ref 39.0–52.0)
Hemoglobin: 12.8 g/dL — ABNORMAL LOW (ref 13.0–17.0)
MCH: 28.6 pg (ref 26.0–34.0)
MCHC: 32.7 g/dL (ref 30.0–36.0)
MCV: 87.3 fL (ref 80.0–100.0)
Platelets: 270 10*3/uL (ref 150–400)
RBC: 4.48 MIL/uL (ref 4.22–5.81)
RDW: 12.6 % (ref 11.5–15.5)
WBC: 9 10*3/uL (ref 4.0–10.5)
nRBC: 0 % (ref 0.0–0.2)

## 2021-09-20 LAB — MAGNESIUM: Magnesium: 2.1 mg/dL (ref 1.7–2.4)

## 2021-09-20 NOTE — ED Notes (Signed)
ED Provider at bedside. 

## 2021-09-20 NOTE — ED Triage Notes (Signed)
Pt arrived via [facility]. Pt took an unknown substance at 1000 this morning. Pt thought it was a sleeping pill but is not sure what it was.  Pt  also fell into a creek  and EMS was called for same. Pt c/o of bilateral foot pain with sores on the soles. Pt complains of cramping in both lower extremities.   EMS Vitals 130/80, HR 72, 99%, CBG 108. Pt alert and oriented on scene.   Pt admits to being a IV drug users. And the last time he used with 0300.

## 2021-09-20 NOTE — ED Provider Notes (Signed)
[place] EMERGENCY DEPT Provider Note   CSN: [id number] Arrival date & time: 09/20/21  1905     History  Chief Complaint  Patient presents with   Foot Pain    Clayton Oconnell is a 31 y.o. male.   Foot Pain  Patient is a 31 year old male presented emergency room under [employer] custody brought in after taking an unknown substance around 10 AM this morning.  He states he was given this by a friend because he has been having some issues sleeping.  EMS and [employer] were called out because of bizarre behavior.  Seems that on arrival and [employer]'s call him fall into a creek.  He has been compliant with their requests since then.  He has not been combative or disoriented.  He informs me that he has had some cramping and pain in his feet.  He states he does not inject into his feet he injects heroin into his arms and indicates his AC bilaterally.  He states he has had some cramping for quite some time.  He states that he uses heroin but is aware that he is primarily using fentanyl.  Denies any other recreational drug use.    Home Medications Prior to Admission medications   Not on File      Allergies    Tylenol [acetaminophen]    Review of Systems   Review of Systems  Physical Exam Updated Vital Signs BP 121/64   Pulse 60   Temp 97.8 F (36.6 C) (Oral)   Resp 12   Ht 6\' 4"  (1.93 m)   Wt 81.6 kg   SpO2 99%   BMI 21.91 kg/m  Physical Exam Vitals and nursing note reviewed.  Constitutional:      General: He is not in acute distress.    Appearance: He is ill-appearing.     Comments: Pleasant but chronically ill-appearing 31 year old male.  Somewhat cachectic  HENT:     Head: Normocephalic and atraumatic.     Nose: Nose normal.     Mouth/Throat:     Mouth: Mucous membranes are dry.     Comments: Poor dentition Eyes:     General: No scleral icterus. Cardiovascular:     Rate and Rhythm: Normal rate and regular rhythm.     Pulses: Normal pulses.     Heart  sounds: Normal heart sounds.  Pulmonary:     Effort: Pulmonary effort is normal. No respiratory distress.     Breath sounds: No wheezing.  Abdominal:     Palpations: Abdomen is soft.     Tenderness: There is no abdominal tenderness. There is no guarding or rebound.  Musculoskeletal:     Cervical back: Normal range of motion.     Right lower leg: No edema.     Left lower leg: No edema.     Comments: Bilateral feet with skin that is peeling off consistent with trench foot  Skin:    General: Skin is warm and dry.     Capillary Refill: Capillary refill takes less than 2 seconds.     Comments: Track marks in bilateral ACs  Neurological:     Mental Status: He is alert. Mental status is at baseline.  Psychiatric:        Mood and Affect: Mood normal.        Behavior: Behavior normal.    ED Results / Procedures / Treatments   Labs (all labs ordered are listed, but only abnormal results are displayed) Labs Reviewed  CBC - Abnormal; Notable for the following components:      Result Value   Hemoglobin 12.8 (*)    All other components within normal limits  COMPREHENSIVE METABOLIC PANEL - Abnormal; Notable for the following components:   BUN 32 (*)    Creatinine, Ser 1.31 (*)    AST 79 (*)    ALT 45 (*)    All other components within normal limits  RAPID URINE DRUG SCREEN, HOSP PERFORMED - Abnormal; Notable for the following components:   Amphetamines POSITIVE (*)    All other components within normal limits  MAGNESIUM    EKG None  Radiology No results found.  Procedures Procedures    Medications Ordered in ED Medications - No data to display  ED Course/ Medical Decision Making/ A&P Clinical Course as of 09/20/21 2117  Tue Sep 20, 2021  1910 Tetanus was updated in 2018 by my review of EMR.  CBC without leukocytosis or significant anemia.  CMP with mild AST elevation along with ALT elevation.  In the general ballpark of the 2:1 ratio expected for alcohol use.  He does  endorse some alcohol use.  Magnesium and potassium are normal. [WF]    Clinical Course User Index [WF] Tedd Sias, PA                           Medical Decision Making   Patient is a 31 year old male presented emergency room under [employer] custody brought in after taking an unknown substance around 10 AM this morning.  He states he was given this by a friend because he has been having some issues sleeping.  EMS and [employer] were called out because of bizarre behavior.  Seems that on arrival and [employer]'s call him fall into a creek.  He has been compliant with their requests since then.  He has not been combative or disoriented.  He informs me that he has had some cramping and pain in his feet.  He states he does not inject into his feet he injects heroin into his arms and indicates his AC bilaterally.  He states he has had some cramping for quite some time.  He states that he uses heroin but is aware that he is primarily using fentanyl.  Denies any other recreational drug use.  Physical exam notable for trench foot.  He is somewhat dry oral mucosa.  Offered IV fluids which she declined.  He has drank several cups of water since he has been in the ER.  CMP unremarkable apart from creatinine which is marginally increased and is at 1.3 and in the setting of elevated BUN and polysubstance abuse suspect that he is somewhat dehydrated.  See clinical course for review of other labs.  Dis[facility] to prison.  Provided resources for polysubstance abuse cessation.  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

## 2021-09-20 NOTE — ED Notes (Addendum)
Reviewed AVS/discharge instruction with patient. Time allotted for and all questions answered. Patient given paper scrubs and clean dry socks prior to discharge. Patient is leaving ED in police custody.

## 2021-09-20 NOTE — Discharge Instructions (Addendum)
I have printed some resources for you about recreational drug and alcohol use cessation.  I have also printed some information about wound care.  Please keep your feet dry will be provided some dry socks today.  Hydrating well.  You will need to have labs rechecked by primary care provider in the next week or 2.

## 2022-10-21 IMAGING — DX DG HAND COMPLETE 3+V*R*
3 series · 3 of 3 positions shown · non-contrast
Comparison: None.

CLINICAL DATA: Right hand pain after injury.

EXAM:
RIGHT HAND - COMPLETE 3+ VIEW

[hand ap]
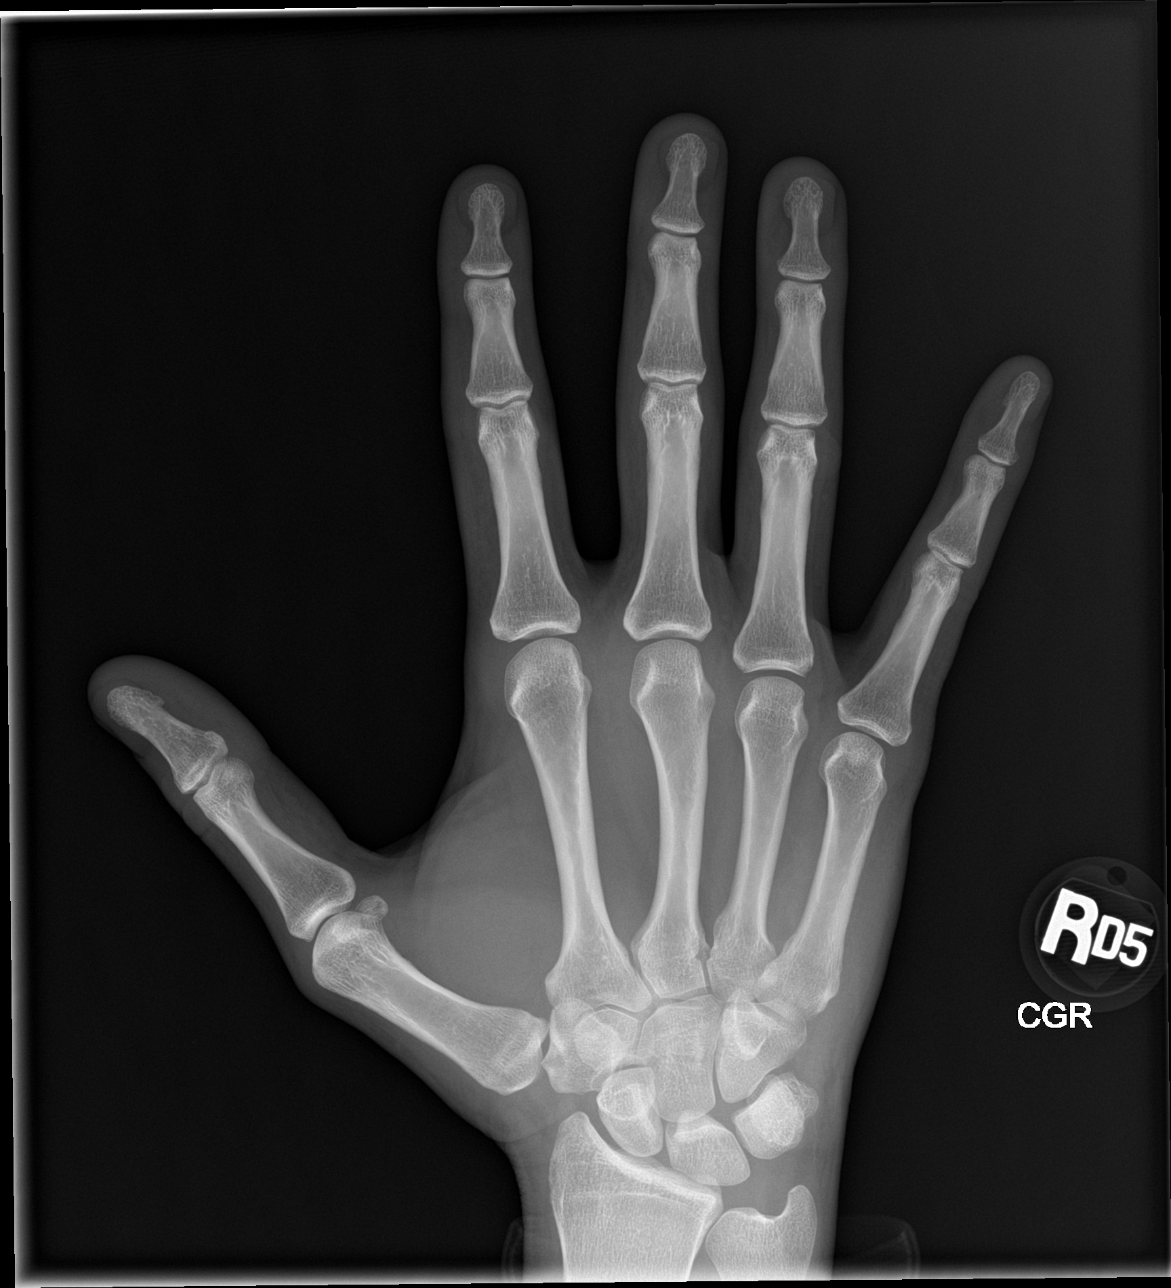

[hand obl]
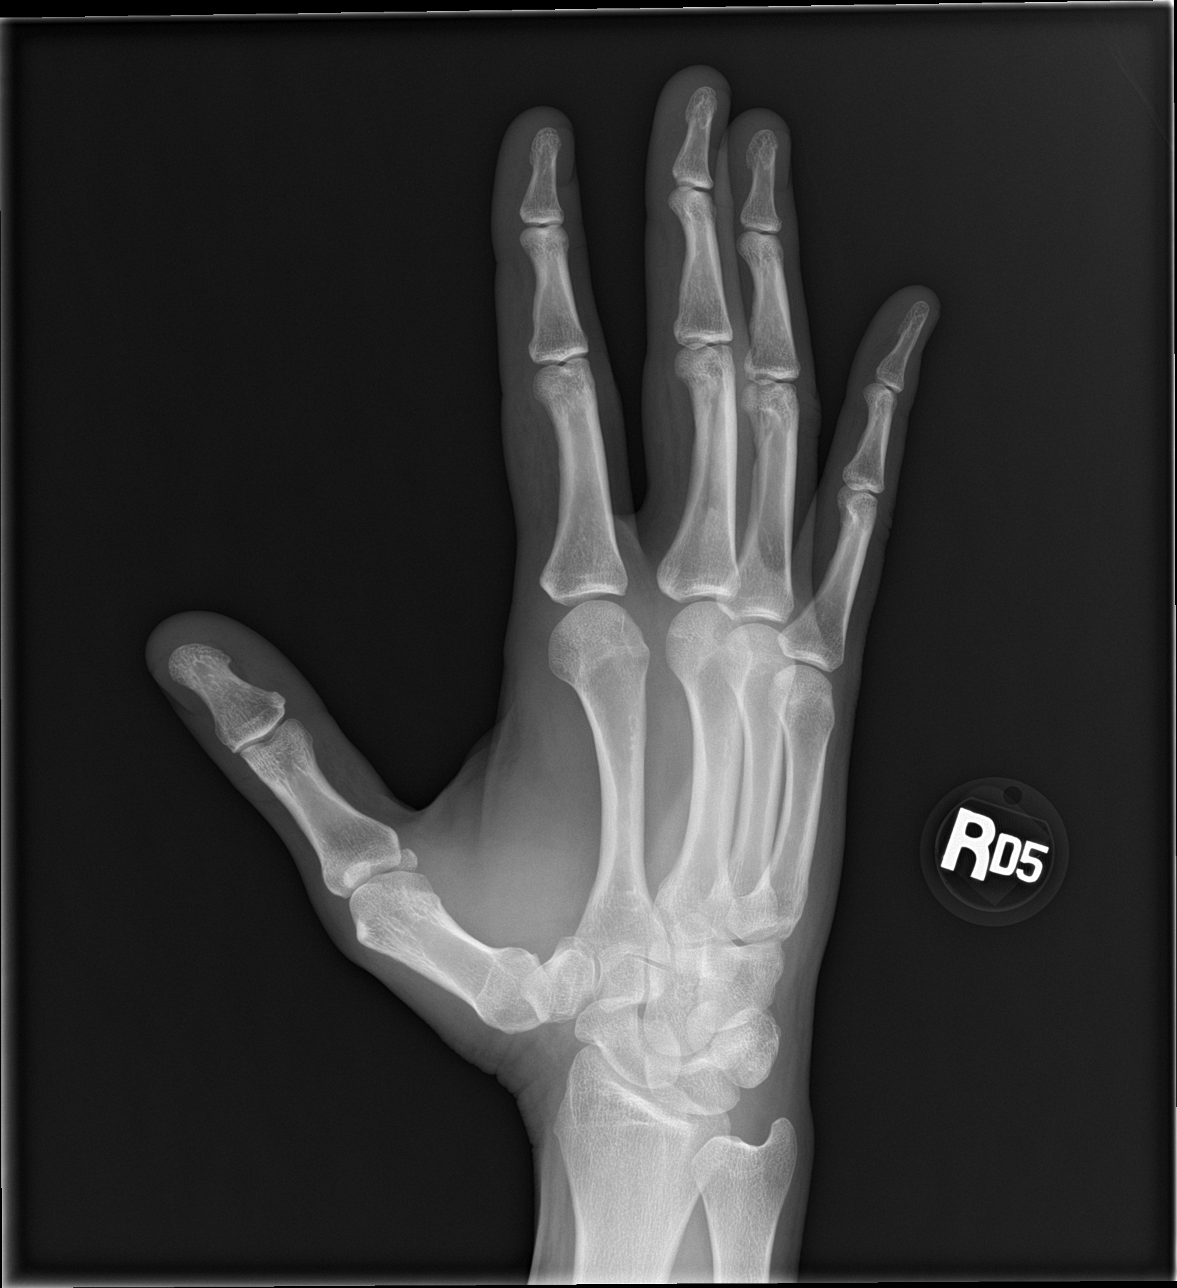

[hand lat]
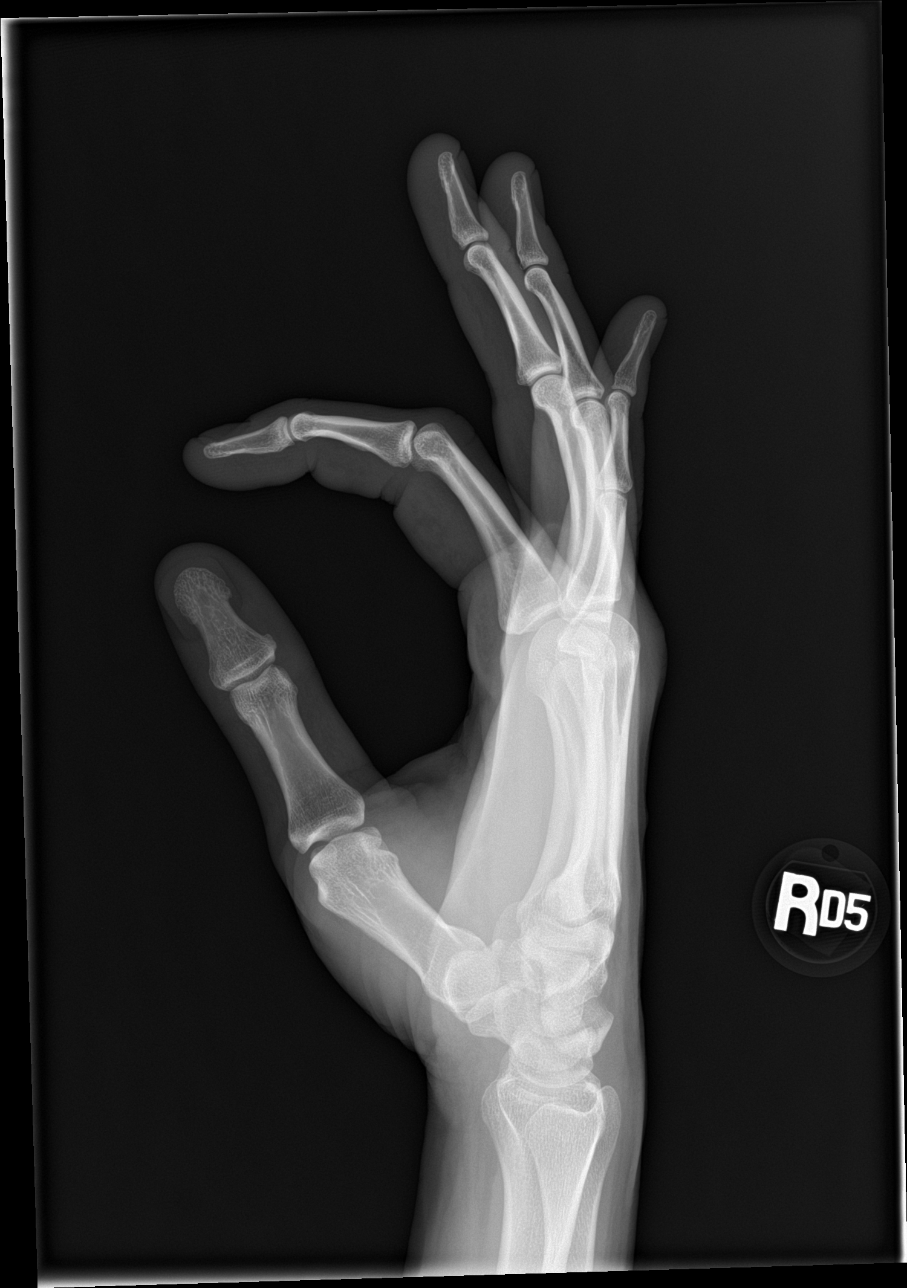

[3 of 3 positions shown; findings below may reference images not displayed]

FINDINGS: There is no evidence of fracture or dislocation. There is no
evidence of arthropathy or other focal bone abnormality. Soft
tissues are unremarkable.
IMPRESSION: Negative.
